# Patient Record
Sex: Female | Born: 1980 | Race: White | Hispanic: No | Marital: Single | Smoking: Current every day smoker
Health system: Southern US, Community
[De-identification: ages and names within clinical notes are randomized; demographics above are authoritative.]

## PROBLEM LIST (undated history)

## (undated) DIAGNOSIS — Z315 Encounter for genetic counseling: Secondary | ICD-10-CM

## (undated) DIAGNOSIS — F419 Anxiety disorder, unspecified: Secondary | ICD-10-CM

## (undated) DIAGNOSIS — F32A Depression, unspecified: Secondary | ICD-10-CM

## (undated) DIAGNOSIS — O99341 Other mental disorders complicating pregnancy, first trimester: Secondary | ICD-10-CM

## (undated) DIAGNOSIS — F319 Bipolar disorder, unspecified: Secondary | ICD-10-CM

## (undated) HISTORY — DX: Bipolar disorder, unspecified: F31.9

## (undated) HISTORY — DX: Other mental disorders complicating pregnancy, first trimester: O99.341

## (undated) HISTORY — DX: Encounter for procreative genetic counseling: Z31.5

---

## 2016-03-19 DIAGNOSIS — F319 Bipolar disorder, unspecified: Secondary | ICD-10-CM

## 2016-03-19 DIAGNOSIS — O99341 Other mental disorders complicating pregnancy, first trimester: Secondary | ICD-10-CM

## 2016-03-19 HISTORY — DX: Bipolar disorder, unspecified: F31.9

## 2016-04-03 DIAGNOSIS — Z315 Encounter for genetic counseling: Secondary | ICD-10-CM

## 2016-04-03 HISTORY — DX: Encounter for procreative genetic counseling: Z31.5

## 2016-08-03 ENCOUNTER — Ambulatory Visit: Payer: Medicaid Other | Admitting: Surgery

## 2016-08-03 ENCOUNTER — Telehealth: Payer: Self-pay | Admitting: General Practice

## 2016-08-03 NOTE — Telephone Encounter (Signed)
Called and left a message for the patient to call the office to reschedule her no showed appointment please rescheduled if patient calls back.

## 2016-10-03 DIAGNOSIS — E669 Obesity, unspecified: Secondary | ICD-10-CM | POA: Insufficient documentation

## 2016-10-03 DIAGNOSIS — O0993 Supervision of high risk pregnancy, unspecified, third trimester: Secondary | ICD-10-CM | POA: Insufficient documentation

## 2016-10-03 DIAGNOSIS — O24419 Gestational diabetes mellitus in pregnancy, unspecified control: Secondary | ICD-10-CM | POA: Insufficient documentation

## 2016-10-11 DIAGNOSIS — F17200 Nicotine dependence, unspecified, uncomplicated: Secondary | ICD-10-CM | POA: Insufficient documentation

## 2018-01-22 HISTORY — PX: WRIST SURGERY: SHX841

## 2019-01-23 HISTORY — PX: ENDOSCOPIC PLANTAR FASCIOTOMY: SUR443

## 2019-08-14 ENCOUNTER — Ambulatory Visit: Payer: Medicaid Other | Admitting: Podiatry

## 2019-08-25 ENCOUNTER — Ambulatory Visit: Payer: Medicaid Other | Admitting: Podiatry

## 2019-08-25 ENCOUNTER — Other Ambulatory Visit: Payer: Self-pay

## 2019-08-25 ENCOUNTER — Ambulatory Visit (INDEPENDENT_AMBULATORY_CARE_PROVIDER_SITE_OTHER): Payer: Medicaid Other

## 2019-08-25 ENCOUNTER — Other Ambulatory Visit: Payer: Self-pay | Admitting: Podiatry

## 2019-08-25 DIAGNOSIS — M722 Plantar fascial fibromatosis: Secondary | ICD-10-CM

## 2019-08-25 DIAGNOSIS — M79671 Pain in right foot: Secondary | ICD-10-CM

## 2019-08-27 ENCOUNTER — Encounter: Payer: Self-pay | Admitting: Podiatry

## 2019-08-27 NOTE — Progress Notes (Signed)
Subjective:  Patient ID: Sherri Jacobson, female    DOB: [birth year],  MRN: [id number]  Chief Complaint  Patient presents with  . Foot Pain    pt is here for right foot bottom/ back of the right heel, pain has been going on for 3-5 months. pt states that the pain is elevated to the touch.    39 y.o. female presents with the above complaint.  Patient presents with complaint of right plantar fasciitis.  Patient states that it has been going on for quite some time.  Patient states his pain is elevated to touch.  Patient was seen by another orthopedic for which she had multiple work-up done with MRIs as well as injections immobilization orthotics management however she has failed all conservative therapy.  She does not have the records with her however she would like for [place] to pursue getting the records.  She states that the pain is gradual patient has tried stretching she has been dealing with this for a long period of time.  She has not had had any surgery to the foot.  She denies any other acute complaints.   Review of Systems: Negative except as noted in the HPI. Denies N/V/F/Ch.  Past Medical History:  Diagnosis Date  . Bipolar disease during pregnancy in first trimester (HCC) 03/19/2016  . Encounter for procreative genetic counseling 04/03/2016   Overview:  Genetic counseling visit on 04/10/2016  Aneuploidy screening/ testing:  First trimester screening; results negative  Carrier screening:  Genetic counseling visit pending    Current Outpatient Medications:  .  IRON PO, Take by mouth., Disp: , Rfl:  .  traMADol (ULTRAM) 50 MG tablet, Take by mouth every 6 (six) hours as needed., Disp: , Rfl:   Social History   Tobacco Use  Smoking Status Not on file    No Known Allergies Objective:  There were no vitals filed for this visit. There is no height or weight on file to calculate BMI. Constitutional Well developed. Well nourished.  Vascular Dorsalis pedis pulses palpable  bilaterally. Posterior tibial pulses palpable bilaterally. Capillary refill normal to all digits.  No cyanosis or clubbing noted. Pedal hair growth normal.  Neurologic Normal speech. Oriented to person, place, and time. Epicritic sensation to light touch grossly present bilaterally.  Dermatologic Nails well groomed and normal in appearance. No open wounds. No skin lesions.  Orthopedic: Normal joint ROM without pain or crepitus bilaterally. No visible deformities. Tender to palpation at the calcaneal tuber right. No pain with calcaneal squeeze right. Ankle ROM diminished range of motion right. Silfverskiold Test: positive right.   Radiographs: Taken and reviewed. No acute fractures or dislocations. No evidence of stress fracture.  Plantar heel spur present. Posterior heel spur present.   Assessment:   1. Foot pain, right    Plan:  Patient was evaluated and treated and all questions answered.  Plantar Fasciitis, right -I reviewed x-ray findings as well as discussed various clinical options.  Given that patient has had had multiple injection to the area without any relief.  Given that patient is ambulating with a cam boot without any relief.  The next best course of action is surgical planning however patient has had a recent MRI done for which I am not able to access through epic.  We will plan on getting paper charts and records and based on the findings either we will reattempt to do another MRI or discuss surgical options at that time.  Patient states understanding.  For now we  will continue to manage with cam boot immobilization. -I will see her back again in 3 weeks to review records.  No follow-ups on file.

## 2019-09-08 ENCOUNTER — Ambulatory Visit: Payer: Medicaid Other | Admitting: Podiatry

## 2019-09-08 ENCOUNTER — Other Ambulatory Visit: Payer: Self-pay

## 2019-09-08 DIAGNOSIS — M216X1 Other acquired deformities of right foot: Secondary | ICD-10-CM

## 2019-09-08 DIAGNOSIS — M21861 Other specified acquired deformities of right lower leg: Secondary | ICD-10-CM

## 2019-09-08 DIAGNOSIS — M722 Plantar fascial fibromatosis: Secondary | ICD-10-CM | POA: Diagnosis not present

## 2019-09-08 DIAGNOSIS — M79671 Pain in right foot: Secondary | ICD-10-CM

## 2019-09-09 ENCOUNTER — Encounter: Payer: Self-pay | Admitting: Podiatry

## 2019-09-09 NOTE — Progress Notes (Signed)
Subjective:  Patient ID: Sherri Jacobson, female    DOB: August 30, 1980,  MRN: [id number]  No chief complaint on file.   39 y.o. female presents with the above complaint.  Patient presents with a follow-up of right plantar fasciitis chronic in nature continuous pain.  She states the pain has not gotten any better.  She is here for surgical intervention at this time.  She has went to multiple podiatrist in the past has multiple injections tried all conservative therapy including immobilization orthotics and has failed all of them.  At this point she would like to discuss surgery to have it fixed.  She states the pain is unbearable she has not been able to ambulate properly.  She denies any other acute complaints.   Review of Systems: Negative except as noted in the HPI. Denies N/V/F/Ch.  Past Medical History:  Diagnosis Date  . Bipolar disease during pregnancy in first trimester (HCC) 03/19/2016  . Encounter for procreative genetic counseling 04/03/2016   Overview:  Genetic counseling visit on 04/10/2016  Aneuploidy screening/ testing:  First trimester screening; results negative  Carrier screening:  Genetic counseling visit pending    Current Outpatient Medications:  .  IRON PO, Take by mouth., Disp: , Rfl:  .  traMADol (ULTRAM) 50 MG tablet, Take by mouth every 6 (six) hours as needed., Disp: , Rfl:   Social History   Tobacco Use  Smoking Status Not on file    No Known Allergies Objective:  There were no vitals filed for this visit. There is no height or weight on file to calculate BMI. Constitutional Well developed. Well nourished.  Vascular Dorsalis pedis pulses palpable bilaterally. Posterior tibial pulses palpable bilaterally. Capillary refill normal to all digits.  No cyanosis or clubbing noted. Pedal hair growth normal.  Neurologic Normal speech. Oriented to person, place, and time. Epicritic sensation to light touch grossly present bilaterally.  Dermatologic Nails well  groomed and normal in appearance. No open wounds. No skin lesions.  Orthopedic: Normal joint ROM without pain or crepitus bilaterally. No visible deformities. Tender to palpation at the calcaneal tuber right. No pain with calcaneal squeeze right. Ankle ROM diminished range of motion right. Silfverskiold Test: positive right.   Radiographs: Taken and reviewed. No acute fractures or dislocations. No evidence of stress fracture.  Plantar heel spur present. Posterior heel spur present.   Assessment:   1. Foot pain, right   2. Plantar fasciitis of right foot   3. Gastrocnemius equinus of right lower extremity    Plan:  Patient was evaluated and treated and all questions answered.  Plantar Fasciitis, right with underlying gastrocnemius equinus -I reviewed x-ray findings as well as discussed various clinical options.  Given that patient has had had multiple injection to the area without any relief.  Given that patient is ambulating with a cam boot without any relief.   -MRI was obtained from [facility].  I reviewed the MRI findings which showed plantar fasciitis chronic in nature.  I discussed with the my surgical planning which included right foot endoscopic plantar fasciotomy with left gastrocnemius recession.  I discussed this in extensive detail with the patient.  I reviewed all my imaging with the patient.  Given that patient has failed all conservative therapy at this is the next best treatment option.  Patient agrees with the plan would like to proceed with the surgery. -I discussed my postop protocol with the patient which includes weightbearing as tolerated in a cam boot followed by transition to surgical  shoe and regular sneakers.  Patient states understanding -Informed surgical risk consent was reviewed and read aloud to the patient.  I reviewed the films.  I have discussed my findings with the patient in great detail.  I have discussed all risks including but not limited to infection,  stiffness, scarring, limp, disability, deformity, damage to blood vessels and nerves, numbness, poor healing, need for braces, arthritis, chronic pain, amputation, death.  All benefits and realistic expectations discussed in great detail.  I have made no promises as to the outcome.  I have provided realistic expectations.  I have offered the patient a 2nd opinion, which they have declined and assured me they preferred to proceed despite the risks -A total of 37 minutes was spent in direct patient care as well as pre and post patient encounter activities.  This includes documentation as well as reviewing patient chart for labs, imaging, past medical, surgical, social, and family history as documented in the EMR.  I have reviewed medication allergies as documented in EMR.  I discussed the etiology of condition and treatment options from conservative to surgical care.  All risks and benefit of the treatment course was discussed in detail.  All questions were answered and return appointment was discussed.  Since the visit completed in an ambulatory/outpatient setting, the patient and/or parent/guardian has been advised to contact the providers office for worsening condition and seek medical treatment and/or call 911 if the patient deems either is necessary. -Patient will also reach out to pain management to review and discussed medication for acute postop pain.  I generally tend to do Percocet 10 mg for acute postop pain.  But I did let the patient know to discuss with her chronic pain management that he will be undergoing surgery and if they would like to prescribe pain medication versus me prescribed for acute postop pain I am more than happy to do that.  Patient states understanding will do that the next scheduled visit which is next week.  No follow-ups on file.

## 2019-09-23 ENCOUNTER — Encounter: Payer: Self-pay | Admitting: *Deleted

## 2019-10-05 ENCOUNTER — Telehealth: Payer: Self-pay | Admitting: Podiatry

## 2019-10-05 DIAGNOSIS — M722 Plantar fascial fibromatosis: Secondary | ICD-10-CM

## 2019-10-05 DIAGNOSIS — M216X1 Other acquired deformities of right foot: Secondary | ICD-10-CM | POA: Diagnosis not present

## 2019-10-05 MED ORDER — OXYCODONE-ACETAMINOPHEN 10-325 MG PO TABS: 1.0000 | ORAL_TABLET | Freq: Four times a day (QID) | ORAL | 0 refills | Status: AC | PRN

## 2019-10-05 MED ORDER — IBUPROFEN 800 MG PO TABS: 800.0000 mg | ORAL_TABLET | Freq: Four times a day (QID) | ORAL | 1 refills | Status: DC | PRN

## 2019-10-05 NOTE — Addendum Note (Signed)
Addended by: Nicholes Rough on: 10/05/2019 04:31 PM   Modules accepted: Orders

## 2019-10-05 NOTE — Telephone Encounter (Signed)
"  I have called twice already.  I had surgery this morning.  I have tried to get my pain medication but I've been told by the pharmacist that it's not there."  Dr. Allena Katz submitted it this morning.  "It's not there.  What am I going to do?  It's going to be too late once he gets the message.  What am I supposed to do?"  I'll call the pharmacy.  I am calling in regards to a prescription for [facility].  The patient said she has called several times and was told the prescription was not there.  "She's correct, no prescriptions are in the [facility]' system.  Please have the doctor resubmit.  We can take the order for the Ibuprofen over the phone."  The Ibuprofen prescription is for 800 mg, quantity of 60, po one q 6 hrs prn pain, one refill, Dr. Nicholes Rough.  I spoke to the pharmacist and I gave them the information for the Ibuprofen but Dr. Allena Katz has to resubmit the Percocet.  "I can't go without pain medication.  I just had surgery!  What am I going to do?  I can't wait until tomorrow!"  You won't have to wait until tomorrow.  I'll send a message directly to Dr. Allena Katz.

## 2019-10-05 NOTE — Addendum Note (Signed)
Addended by: Nicholes Rough on: 10/05/2019 09:19 AM   Modules accepted: Orders

## 2019-10-09 ENCOUNTER — Telehealth: Payer: Self-pay

## 2019-10-09 NOTE — Telephone Encounter (Signed)
DOS 10/05/2019  EPF RT - 47340 GASTROCNEMIUS RECESS RT - 37096  HEALTHY BLUE EFFECTIVE 07/23/2019  RECEIVED AUTH # [id number] GOOD FROM 10/05/2019 - 11/03/2019

## 2019-10-13 ENCOUNTER — Ambulatory Visit (INDEPENDENT_AMBULATORY_CARE_PROVIDER_SITE_OTHER): Payer: Medicaid Other | Admitting: Podiatry

## 2019-10-13 ENCOUNTER — Other Ambulatory Visit: Payer: Self-pay

## 2019-10-13 ENCOUNTER — Encounter: Payer: Self-pay | Admitting: Podiatry

## 2019-10-13 VITALS — BP 142/90 | HR 113 | Temp 99.5°F

## 2019-10-13 DIAGNOSIS — M722 Plantar fascial fibromatosis: Secondary | ICD-10-CM

## 2019-10-13 DIAGNOSIS — M21861 Other specified acquired deformities of right lower leg: Secondary | ICD-10-CM

## 2019-10-13 DIAGNOSIS — M216X1 Other acquired deformities of right foot: Secondary | ICD-10-CM

## 2019-10-13 DIAGNOSIS — Z9889 Other specified postprocedural states: Secondary | ICD-10-CM

## 2019-10-13 DIAGNOSIS — M62461 Contracture of muscle, right lower leg: Secondary | ICD-10-CM

## 2019-10-13 MED ORDER — OXYCODONE-ACETAMINOPHEN 10-325 MG PO TABS: 1.0000 | ORAL_TABLET | ORAL | 0 refills | Status: DC | PRN

## 2019-10-13 NOTE — Progress Notes (Signed)
  Subjective:  Patient ID: Sherri Jacobson, female    DOB: 08-Nov-1980,  MRN: [id number]  Chief Complaint  Patient presents with  . Routine Post Op    POV #1 DOS 10/05/19 EPF AND GASTROCNEMIUS RECESS RT     39 y.o. female returns for post-op check.  Patient is doing well.  She has mild pain.  She has been able to ambulate slowly with the boot on.  She denies any other acute complaints.  Her bandages are clean dry and intact.  Review of Systems: Negative except as noted in the HPI. Denies N/V/F/Ch.  Past Medical History:  Diagnosis Date  . Bipolar disease during pregnancy in first trimester (HCC) 03/19/2016  . Encounter for procreative genetic counseling 04/03/2016   Overview:  Genetic counseling visit on 04/10/2016  Aneuploidy screening/ testing:  First trimester screening; results negative  Carrier screening:  Genetic counseling visit pending    Current Outpatient Medications:  .  ibuprofen (ADVIL) 800 MG tablet, Take 1 tablet (800 mg total) by mouth every 6 (six) hours as needed., Disp: 60 tablet, Rfl: 1 .  IRON PO, Take by mouth., Disp: , Rfl:  .  oxyCODONE-acetaminophen (PERCOCET) 10-325 MG tablet, Take 1 tablet by mouth every 6 (six) hours as needed for up to 8 days for pain., Disp: 30 tablet, Rfl: 0 .  oxyCODONE-acetaminophen (PERCOCET) 10-325 MG tablet, Take 1 tablet by mouth every 4 (four) hours as needed for pain., Disp: 30 tablet, Rfl: 0 .  traMADol (ULTRAM) 50 MG tablet, Take by mouth every 6 (six) hours as needed., Disp: , Rfl:   Social History   Tobacco Use  Smoking Status Not on file    No Known Allergies Objective:   Vitals:   10/13/19 0933  BP: (!) 142/90  Pulse: (!) 113  Temp: 99.5 F (37.5 C)   There is no height or weight on file to calculate BMI. Constitutional Well developed. Well nourished.  Vascular Foot warm and well perfused. Capillary refill normal to all digits.   Neurologic Normal speech. Oriented to person, place, and time. Epicritic sensation  to light touch grossly present bilaterally.  Dermatologic Skin healing well without signs of infection. Skin edges well coapted without signs of infection.  Orthopedic: Tenderness to palpation noted about the surgical site.   Radiographs: None Assessment:   1. Plantar fasciitis of right foot   2. Gastrocnemius equinus of right lower extremity   3. Status post foot surgery    Plan:  Patient was evaluated and treated and all questions answered.  S/p foot surgery right -Progressing as expected post-operatively. -XR: None -WB Status: Weightbearing as tolerated in cam boot -Sutures: Intact.  No signs of dehiscence noted.  No clinical signs of infection noted. -Medications: Percocet -Foot redressed.  No follow-ups on file.

## 2019-10-20 ENCOUNTER — Encounter: Payer: Medicaid Other | Admitting: Podiatry

## 2019-10-22 ENCOUNTER — Ambulatory Visit: Payer: Medicaid Other | Admitting: Podiatry

## 2019-10-22 ENCOUNTER — Other Ambulatory Visit: Payer: Self-pay

## 2019-10-22 ENCOUNTER — Encounter: Payer: Self-pay | Admitting: Podiatry

## 2019-10-22 DIAGNOSIS — B351 Tinea unguium: Secondary | ICD-10-CM

## 2019-10-22 DIAGNOSIS — M722 Plantar fascial fibromatosis: Secondary | ICD-10-CM

## 2019-10-22 DIAGNOSIS — Z79899 Other long term (current) drug therapy: Secondary | ICD-10-CM

## 2019-10-22 DIAGNOSIS — Z9889 Other specified postprocedural states: Secondary | ICD-10-CM

## 2019-10-22 DIAGNOSIS — M21861 Other specified acquired deformities of right lower leg: Secondary | ICD-10-CM

## 2019-10-22 NOTE — Progress Notes (Signed)
Subjective:  Patient ID: Sherri Jacobson, female    DOB: 07-Dec-1980,  MRN: [id number]  Chief Complaint  Patient presents with  . Routine Post Op    POV #2 DOS 10/05/19 EPF AND GASTROCNEMIUS RECESS RT     39 y.o. female returns for post-op check.  Patient is doing well.  She has mild pain.  She has been able to ambulate with the boot on without acute complaints.  She is here to take her stitches out today.  She has secondary complaint of bilateral hallux onychomycosis for which she would like to discuss treatment options.  She states that this has been bothering for quite some time it came out of nowhere.  She denies any other acute complaints she has not tried any other treatment options  Review of Systems: Negative except as noted in the HPI. Denies N/V/F/Ch.  Past Medical History:  Diagnosis Date  . Bipolar disease during pregnancy in first trimester (HCC) 03/19/2016  . Encounter for procreative genetic counseling 04/03/2016   Overview:  Genetic counseling visit on 04/10/2016  Aneuploidy screening/ testing:  First trimester screening; results negative  Carrier screening:  Genetic counseling visit pending    Current Outpatient Medications:  .  ibuprofen (ADVIL) 800 MG tablet, Take 1 tablet (800 mg total) by mouth every 6 (six) hours as needed., Disp: 60 tablet, Rfl: 1 .  IRON PO, Take by mouth., Disp: , Rfl:  .  oxyCODONE-acetaminophen (PERCOCET) 10-325 MG tablet, Take 1 tablet by mouth every 4 (four) hours as needed for pain., Disp: 30 tablet, Rfl: 0 .  traMADol (ULTRAM) 50 MG tablet, Take by mouth every 6 (six) hours as needed., Disp: , Rfl:   Social History   Tobacco Use  Smoking Status Not on file    No Known Allergies Objective:   There were no vitals filed for this visit. There is no height or weight on file to calculate BMI. Constitutional Well developed. Well nourished.  Vascular Foot warm and well perfused. Capillary refill normal to all digits.   Neurologic Normal  speech. Oriented to person, place, and time. Epicritic sensation to light touch grossly present bilaterally.  Dermatologic Skin healing well without signs of infection. Skin edges well coapted without signs of infection.  Thickened elongated dystrophic toenail noted to bilateral hallux.  Mild pain on palpation mild dystrophy noted bilaterally.  Orthopedic: Tenderness to palpation noted about the surgical site.   Radiographs: None Assessment:   1. Encounter for long-term current use of medication   2. Plantar fasciitis of right foot   3. Gastrocnemius equinus of right lower extremity   4. Status post foot surgery   5. Onychomycosis due to dermatophyte    Plan:  Patient was evaluated and treated and all questions answered.  S/p foot surgery right -Progressing as expected post-operatively. -XR: None -WB Status: She can begin transition from cam boot into regular sneakers. -Sutures: Stitches were removed.  No signs of dehiscence noted no clinical signs of infection noted. -Medications: Percocet -Physical therapy prescription was given to the patient to undergo physical therapy for general deconditioning as well as gait training at [facility].  Onychomycosis bilateral hallux -Educated the patient on the etiology of onychomycosis and various treatment options associated with improving the fungal load.  I explained to the patient that there is 3 treatment options available to treat the onychomycosis including topical, p.o., laser treatment.  Patient elected to undergo p.o. options with Lamisil/terbinafine therapy.  In order for me to start the medication therapy,  I explained to the patient the importance of evaluating the liver and obtaining the liver function test.  Once the liver function test comes back normal I will start him on 41-month course of Lamisil therapy.  Patient understood all risk and would like to proceed with Lamisil therapy.  I have asked the patient to immediately stop the  Lamisil therapy if she has any reactions to it and call the office or go to the emergency room right away.  Patient states understanding   No follow-ups on file.

## 2019-11-03 ENCOUNTER — Encounter: Payer: Medicaid Other | Admitting: Podiatry

## 2019-11-05 ENCOUNTER — Other Ambulatory Visit: Payer: Self-pay | Admitting: Podiatry

## 2019-11-17 ENCOUNTER — Other Ambulatory Visit: Payer: Self-pay

## 2019-11-17 ENCOUNTER — Ambulatory Visit (INDEPENDENT_AMBULATORY_CARE_PROVIDER_SITE_OTHER): Payer: Medicaid Other | Admitting: Podiatry

## 2019-11-17 ENCOUNTER — Encounter: Payer: Self-pay | Admitting: Podiatry

## 2019-11-17 DIAGNOSIS — M722 Plantar fascial fibromatosis: Secondary | ICD-10-CM

## 2019-11-17 DIAGNOSIS — Z9889 Other specified postprocedural states: Secondary | ICD-10-CM

## 2019-11-17 NOTE — Progress Notes (Signed)
Subjective:  Patient ID: Sherri Jacobson, female    DOB: 29-Jan-1980,  MRN: [id number]  Chief Complaint  Patient presents with  . Routine Post Op    POV #3 DOS 10/05/19 EPF AND GASTROCNEMIUS RECESS RT  "Its getting better every day, but still very numb"     39 y.o. female returns for post-op check.  Overall patient is doing really well.  She does not have any pain.  She just has occasional numbness.  She denies any other acute complaints.  She would like to know if she can go back to regular activities.  She states physical therapy helped for a little bit now she is doing it herself.  Review of Systems: Negative except as noted in the HPI. Denies N/V/F/Ch.  Past Medical History:  Diagnosis Date  . Bipolar disease during pregnancy in first trimester (HCC) 03/19/2016  . Encounter for procreative genetic counseling 04/03/2016   Overview:  Genetic counseling visit on 04/10/2016  Aneuploidy screening/ testing:  First trimester screening; results negative  Carrier screening:  Genetic counseling visit pending    Current Outpatient Medications:  .  ibuprofen (ADVIL) 800 MG tablet, TAKE 1 TABLET BY MOUTH EVERY 6 HOURS AS NEEDED, Disp: 60 tablet, Rfl: 1 .  IRON PO, Take by mouth., Disp: , Rfl:  .  oxyCODONE-acetaminophen (PERCOCET) 10-325 MG tablet, Take 1 tablet by mouth every 4 (four) hours as needed for pain., Disp: 30 tablet, Rfl: 0 .  traMADol (ULTRAM) 50 MG tablet, Take by mouth every 6 (six) hours as needed., Disp: , Rfl:   Social History   Tobacco Use  Smoking Status Not on file    No Known Allergies Objective:   There were no vitals filed for this visit. There is no height or weight on file to calculate BMI. Constitutional Well developed. Well nourished.  Vascular Foot warm and well perfused. Capillary refill normal to all digits.   Neurologic Normal speech. Oriented to person, place, and time. Epicritic sensation to light touch grossly present bilaterally.  Dermatologic  good  range of motion noted at the ankle joint.  Mild tenderness to the medial calcaneal tuber otherwise good correction noted.  Thickened elongated dystrophic toenail noted to bilateral hallux.  Mild pain on palpation mild dystrophy noted bilaterally.  Orthopedic:  No tenderness to palpation noted about the surgical site.   Radiographs: None Assessment:   1. Plantar fasciitis of right foot   2. Status post foot surgery    Plan:  Patient was evaluated and treated and all questions answered.  S/p foot surgery right -Progressing as expected post-operatively. -XR: None -WB Status: Regular shoes -Sutures: None -Medications: None -Patient has done physical therapy.  Her pain has clinically healed.  She has been able to transition to regular sneaker without any issues. -I discussed with her that if any foot and ankle issues arise in the future come back and see me.  Patient states understanding.  Onychomycosis bilateral hallux -Educated the patient on the etiology of onychomycosis and various treatment options associated with improving the fungal load.  I explained to the patient that there is 3 treatment options available to treat the onychomycosis including topical, p.o., laser treatment.  Patient elected to undergo p.o. options with Lamisil/terbinafine therapy.  In order for me to start the medication therapy, I explained to the patient the importance of evaluating the liver and obtaining the liver function test.  Once the liver function test comes back normal I will start him on 61-month course of  Lamisil therapy.  Patient understood all risk and would like to proceed with Lamisil therapy.  I have asked the patient to immediately stop the Lamisil therapy if she has any reactions to it and call the office or go to the emergency room right away.  Patient states understanding   No follow-ups on file.

## 2019-11-24 ENCOUNTER — Encounter: Payer: Self-pay | Admitting: *Deleted

## 2019-11-24 ENCOUNTER — Emergency Department
Admission: EM | Admit: 2019-11-24 | Discharge: 2019-11-24 | Disposition: A | Discharge status: Home / Self Care | Payer: Medicaid Other | Attending: Emergency Medicine | Admitting: Emergency Medicine

## 2019-11-24 ENCOUNTER — Other Ambulatory Visit: Payer: Self-pay

## 2019-11-24 ENCOUNTER — Emergency Department: Payer: Medicaid Other

## 2019-11-24 DIAGNOSIS — M25561 Pain in right knee: Secondary | ICD-10-CM | POA: Diagnosis present

## 2019-11-24 DIAGNOSIS — Z5321 Procedure and treatment not carried out due to patient leaving prior to being seen by health care provider: Secondary | ICD-10-CM | POA: Insufficient documentation

## 2019-11-24 LAB — CBC WITH DIFFERENTIAL/PLATELET
Abs Immature Granulocytes: 0.11 10*3/uL — ABNORMAL HIGH (ref 0.00–0.07)
Basophils Absolute: 0.1 10*3/uL (ref 0.0–0.1)
Basophils Relative: 1 %
Eosinophils Absolute: 0.3 10*3/uL (ref 0.0–0.5)
Eosinophils Relative: 2 %
HCT: 41 % (ref 36.0–46.0)
Hemoglobin: 13.2 g/dL (ref 12.0–15.0)
Immature Granulocytes: 1 %
Lymphocytes Relative: 31 %
Lymphs Abs: 3.9 10*3/uL (ref 0.7–4.0)
MCH: 29.2 pg (ref 26.0–34.0)
MCHC: 32.2 g/dL (ref 30.0–36.0)
MCV: 90.7 fL (ref 80.0–100.0)
Monocytes Absolute: 0.7 10*3/uL (ref 0.1–1.0)
Monocytes Relative: 6 %
Neutro Abs: 7.6 10*3/uL (ref 1.7–7.7)
Neutrophils Relative %: 59 %
Platelets: 285 10*3/uL (ref 150–400)
RBC: 4.52 MIL/uL (ref 3.87–5.11)
RDW: 13 % (ref 11.5–15.5)
WBC: 12.7 10*3/uL — ABNORMAL HIGH (ref 4.0–10.5)
nRBC: 0 % (ref 0.0–0.2)

## 2019-11-24 LAB — BASIC METABOLIC PANEL
Anion gap: 9 (ref 5–15)
BUN: 11 mg/dL (ref 6–20)
CO2: 26 mmol/L (ref 22–32)
Calcium: 8.8 mg/dL — ABNORMAL LOW (ref 8.9–10.3)
Chloride: 104 mmol/L (ref 98–111)
Creatinine, Ser: 0.59 mg/dL (ref 0.44–1.00)
GFR, Estimated: >60 mL/min (ref >60)
Glucose, Bld: 110 mg/dL — ABNORMAL HIGH (ref 70–99)
Potassium: 3.7 mmol/L (ref 3.5–5.1)
Sodium: 139 mmol/L (ref 135–145)

## 2019-11-24 NOTE — ED Triage Notes (Signed)
Pt had surgery on right leg 8 weeks ago for plantar fasciitis.  No pt having right lateral knee pain and is concerned of blood clot in right lower leg.  Pain for 4 days.  Pain shoots down into right calf.    Pt alert

## 2019-11-24 NOTE — ED Notes (Signed)
No answer when called several times from lobby 

## 2019-11-24 NOTE — ED Notes (Signed)
No answer when called several times from lobby & cell phone 

## 2019-11-25 ENCOUNTER — Telehealth: Payer: Self-pay | Admitting: Emergency Medicine

## 2019-11-25 NOTE — Telephone Encounter (Addendum)
Called patient due to left emergency department before provider exam to inquire about condition and follow up plans.  Left message   She called me back.  I asked her to call her doctor and have them review her labs and the ultrasound

## 2020-02-02 ENCOUNTER — Other Ambulatory Visit: Payer: Self-pay | Admitting: Podiatry

## 2020-02-02 LAB — HEPATIC FUNCTION PANEL
ALT: 38 IU/L — ABNORMAL HIGH (ref 0–32)
AST: 22 IU/L (ref 0–40)
Albumin: 4.5 g/dL (ref 3.8–4.8)
Alkaline Phosphatase: 79 IU/L (ref 44–121)
Bilirubin Total: 0.2 mg/dL (ref 0.0–1.2)
Bilirubin, Direct: <0.1 mg/dL (ref 0.00–0.40)
Total Protein: 7.4 g/dL (ref 6.0–8.5)

## 2020-02-02 MED ORDER — TERBINAFINE HCL 250 MG PO TABS: 250.0000 mg | ORAL_TABLET | Freq: Every day | ORAL | 0 refills | Status: DC

## 2020-03-24 ENCOUNTER — Encounter: Payer: Medicaid Other | Admitting: Podiatry

## 2020-04-12 ENCOUNTER — Ambulatory Visit: Payer: Medicaid Other | Admitting: Podiatry

## 2020-04-14 ENCOUNTER — Encounter: Payer: Self-pay | Admitting: Podiatry

## 2020-04-14 ENCOUNTER — Ambulatory Visit: Payer: Medicaid Other | Admitting: Podiatry

## 2020-04-14 ENCOUNTER — Other Ambulatory Visit: Payer: Self-pay

## 2020-04-14 DIAGNOSIS — M722 Plantar fascial fibromatosis: Secondary | ICD-10-CM | POA: Diagnosis not present

## 2020-04-14 DIAGNOSIS — M216X2 Other acquired deformities of left foot: Secondary | ICD-10-CM

## 2020-04-14 DIAGNOSIS — M21862 Other specified acquired deformities of left lower leg: Secondary | ICD-10-CM

## 2020-04-14 MED ORDER — MELOXICAM 15 MG PO TABS: 15.0000 mg | ORAL_TABLET | Freq: Every day | ORAL | 2 refills | Status: DC

## 2020-04-14 NOTE — Progress Notes (Signed)
Subjective:  Patient ID: Sherri Jacobson, female    DOB: [birth year],  MRN: [id number]  Chief Complaint  Patient presents with  . Foot Pain    "My right foot is perfect, but now my left foot is doing the exact same thing as the right one.  It started about 2 weeks ago and its very painful in the back of the heel and goes up my leg.  Its painful to stand up and walk"    40 y.o. female presents with the above complaint.  Patient now presents to the complaint of left heel pain.  Patient states is doing the same exact thing is the right one.  The right side is doing wonderful.  She would like to discuss treatment options for the left side.  She states that she does not want injection as it has not helped as much.  She would like to discuss other conservative treatment options.  Is painful when standing up and walking.  Her pain scale 7 out of 10 is progressive gotten worse is sharp shooting to the heel.  She denies any other acute complaints.   Review of Systems: Negative except as noted in the HPI. Denies N/V/F/Ch.  Past Medical History:  Diagnosis Date  . Bipolar disease during pregnancy in first trimester (HCC) 03/19/2016  . Encounter for procreative genetic counseling 04/03/2016   Overview:  Genetic counseling visit on 04/10/2016  Aneuploidy screening/ testing:  First trimester screening; results negative  Carrier screening:  Genetic counseling visit pending    Current Outpatient Medications:  .  meloxicam (MOBIC) 15 MG tablet, Take 1 tablet (15 mg total) by mouth daily., Disp: 30 tablet, Rfl: 2 .  terbinafine (LAMISIL) 250 MG tablet, Take 1 tablet (250 mg total) by mouth daily., Disp: 90 tablet, Rfl: 0 .  ALPRAZolam (XANAX) 0.5 MG tablet, Take 0.5 mg by mouth 2 (two) times daily as needed., Disp: , Rfl:  .  amoxicillin (AMOXIL) 500 MG tablet, Take 500 mg by mouth every 8 (eight) hours., Disp: , Rfl:  .  gabapentin (NEURONTIN) 300 MG capsule, Take by mouth., Disp: , Rfl:  .  ibuprofen (ADVIL)  800 MG tablet, TAKE 1 TABLET BY MOUTH EVERY 6 HOURS AS NEEDED, Disp: 60 tablet, Rfl: 1 .  IRON PO, Take by mouth., Disp: , Rfl:  .  LORazepam (ATIVAN) 0.5 MG tablet, , Disp: , Rfl:  .  methocarbamol (ROBAXIN) 500 MG tablet, Take 500 mg by mouth 2 (two) times daily., Disp: , Rfl:  .  oxyCODONE-acetaminophen (PERCOCET) 10-325 MG tablet, Take 1 tablet by mouth every 4 (four) hours as needed for pain., Disp: 30 tablet, Rfl: 0 .  predniSONE (STERAPRED UNI-PAK 48 TAB) 10 MG (48) TBPK tablet, Take by mouth as directed., Disp: , Rfl:  .  tiZANidine (ZANAFLEX) 4 MG tablet, TAKE 1 TO 2 TABLETS BY MOUTH EVERY NIGHT AT BEDTIME AS NEEDED FOR INSOMNIA, Disp: , Rfl:  .  traMADol (ULTRAM) 50 MG tablet, Take by mouth every 6 (six) hours as needed., Disp: , Rfl:   Social History   Tobacco Use  Smoking Status Current Every Day Smoker  Smokeless Tobacco Never Used    No Known Allergies Objective:  There were no vitals filed for this visit. There is no height or weight on file to calculate BMI. Constitutional Well developed. Well nourished.  Vascular Dorsalis pedis pulses palpable bilaterally. Posterior tibial pulses palpable bilaterally. Capillary refill normal to all digits.  No cyanosis or clubbing noted. Pedal hair growth  normal.  Neurologic Normal speech. Oriented to person, place, and time. Epicritic sensation to light touch grossly present bilaterally.  Dermatologic Nails well groomed and normal in appearance. No open wounds. No skin lesions.  Orthopedic: Normal joint ROM without pain or crepitus bilaterally. No visible deformities. Tender to palpation at the calcaneal tuber left. No pain with calcaneal squeeze left. Ankle ROM diminished range of motion left. Silfverskiold Test: positive left.   Radiographs: None  Assessment:   1. Plantar fasciitis, left   2. Gastrocnemius equinus, left    Plan:  Patient was evaluated and treated and all questions answered.  Plantar Fasciitis,  left - XR reviewed as above.  - Educated on icing and stretching. Instructions given.  -I will hold off on any injection as patient does not want any injection and she states that it does not help her. - DME: Continue wearing plantar Fascial Brace and stretching. - Pharmacologic management: Mobic -I will discuss surgical intervention during next clinical visit if there is no improvement with some of these conservative treatment options.  Patient states understanding. -I will plan on obtaining preoperative x-ray during next clinical visit   Onychomycosis bilateral hallux -Educated the patient on the etiology of onychomycosis and various treatment options associated with improving the fungal load.  I explained to the patient that there is 3 treatment options available to treat the onychomycosis including topical, p.o., laser treatment.  Patient elected to undergo p.o. options with Lamisil/terbinafine therapy.  In order for me to start the medication therapy, I explained to the patient the importance of evaluating the liver and obtaining the liver function test.  Once the liver function test comes back normal I will start him on 27-month course of Lamisil therapy.  Patient understood all risk and would like to proceed with Lamisil therapy.  I have asked the patient to immediately stop the Lamisil therapy if she has any reactions to it and call the office or go to the emergency room right away.  Patient states understanding   No follow-ups on file.

## 2020-05-03 ENCOUNTER — Other Ambulatory Visit: Payer: Self-pay

## 2020-05-03 ENCOUNTER — Ambulatory Visit: Payer: Medicaid Other | Admitting: Podiatry

## 2020-05-05 ENCOUNTER — Ambulatory Visit: Payer: Medicaid Other | Admitting: Podiatry

## 2020-05-12 ENCOUNTER — Other Ambulatory Visit: Payer: Self-pay

## 2020-05-12 ENCOUNTER — Ambulatory Visit: Payer: Medicaid Other | Admitting: Podiatry

## 2020-05-12 ENCOUNTER — Ambulatory Visit (INDEPENDENT_AMBULATORY_CARE_PROVIDER_SITE_OTHER): Payer: Medicaid Other

## 2020-05-12 DIAGNOSIS — M216X2 Other acquired deformities of left foot: Secondary | ICD-10-CM | POA: Diagnosis not present

## 2020-05-12 DIAGNOSIS — M722 Plantar fascial fibromatosis: Secondary | ICD-10-CM

## 2020-05-12 DIAGNOSIS — Z01818 Encounter for other preprocedural examination: Secondary | ICD-10-CM

## 2020-05-12 DIAGNOSIS — M21862 Other specified acquired deformities of left lower leg: Secondary | ICD-10-CM

## 2020-05-17 ENCOUNTER — Encounter: Payer: Self-pay | Admitting: Podiatry

## 2020-05-17 NOTE — Progress Notes (Signed)
Subjective:  Patient ID: Sherri Jacobson, female    DOB: [birth year],  MRN: [id number]  Chief Complaint  Patient presents with  . Plantar Fasciitis    Left foot  PT stated that she is still in a lot of pain with her left foot     40 y.o. female presents with the above complaint.  Patient presents with follow-up of left plantar fasciitis.  She states is not getting better.  The injection and the bracing none of which has helped.  She would like to discuss surgical option for left side as well.   Review of Systems: Negative except as noted in the HPI. Denies N/V/F/Ch.  Past Medical History:  Diagnosis Date  . Bipolar disease during pregnancy in first trimester (HCC) 03/19/2016  . Encounter for procreative genetic counseling 04/03/2016   Overview:  Genetic counseling visit on 04/10/2016  Aneuploidy screening/ testing:  First trimester screening; results negative  Carrier screening:  Genetic counseling visit pending    Current Outpatient Medications:  .  terbinafine (LAMISIL) 250 MG tablet, Take 1 tablet (250 mg total) by mouth daily., Disp: 90 tablet, Rfl: 0 .  ALPRAZolam (XANAX) 0.5 MG tablet, Take 0.5 mg by mouth 2 (two) times daily as needed., Disp: , Rfl:  .  amoxicillin (AMOXIL) 500 MG tablet, Take 500 mg by mouth every 8 (eight) hours., Disp: , Rfl:  .  gabapentin (NEURONTIN) 300 MG capsule, Take by mouth., Disp: , Rfl:  .  ibuprofen (ADVIL) 800 MG tablet, TAKE 1 TABLET BY MOUTH EVERY 6 HOURS AS NEEDED, Disp: 60 tablet, Rfl: 1 .  IRON PO, Take by mouth., Disp: , Rfl:  .  LORazepam (ATIVAN) 0.5 MG tablet, , Disp: , Rfl:  .  meloxicam (MOBIC) 15 MG tablet, Take 1 tablet (15 mg total) by mouth daily., Disp: 30 tablet, Rfl: 2 .  methocarbamol (ROBAXIN) 500 MG tablet, Take 500 mg by mouth 2 (two) times daily., Disp: , Rfl:  .  oxyCODONE-acetaminophen (PERCOCET) 10-325 MG tablet, Take 1 tablet by mouth every 4 (four) hours as needed for pain., Disp: 30 tablet, Rfl: 0 .  predniSONE  (STERAPRED UNI-PAK 48 TAB) 10 MG (48) TBPK tablet, Take by mouth as directed., Disp: , Rfl:  .  tiZANidine (ZANAFLEX) 4 MG tablet, TAKE 1 TO 2 TABLETS BY MOUTH EVERY NIGHT AT BEDTIME AS NEEDED FOR INSOMNIA, Disp: , Rfl:  .  traMADol (ULTRAM) 50 MG tablet, Take by mouth every 6 (six) hours as needed., Disp: , Rfl:   Social History   Tobacco Use  Smoking Status Current Every Day Smoker  Smokeless Tobacco Never Used    No Known Allergies Objective:  There were no vitals filed for this visit. There is no height or weight on file to calculate BMI. Constitutional Well developed. Well nourished.  Vascular Dorsalis pedis pulses palpable bilaterally. Posterior tibial pulses palpable bilaterally. Capillary refill normal to all digits.  No cyanosis or clubbing noted. Pedal hair growth normal.  Neurologic Normal speech. Oriented to person, place, and time. Epicritic sensation to light touch grossly present bilaterally.  Dermatologic Nails well groomed and normal in appearance. No open wounds. No skin lesions.  Orthopedic: Normal joint ROM without pain or crepitus bilaterally. No visible deformities. Tender to palpation at the calcaneal tuber left. No pain with calcaneal squeeze left. Ankle ROM diminished range of motion left. Silfverskiold Test: positive left.   Radiographs: None  Assessment:   1. Plantar fasciitis, left   2. Gastrocnemius equinus, left   3.  Preoperative examination    Plan:  Patient was evaluated and treated and all questions answered.  Plantar Fasciitis, left with underlying gastrocnemius equinus -Clinically her pain has not improved with the plantar fascial bracing strain injection.  At this time I believe patient will benefit from surgical management of this.  Given the amount of pain that she is having she would like to go out and focuses on surgical as it is not helping her at all.  I discussed with her in extensive detail about the surgical plan as well as  my preop postoperative plan in extensive detail.  She had previous surgery done in the past by me that seems to be working well.  We will plan on doing the same exact thing.  I discussed this with the patient in extensive detail she states understanding would like to proceed with the surgery. -She can be weightbearing as tolerated in cam boot. -Informed surgical risk consent was reviewed and read aloud to the patient.  I reviewed the films.  I have discussed my findings with the patient in great detail.  I have discussed all risks including but not limited to infection, stiffness, scarring, limp, disability, deformity, damage to blood vessels and nerves, numbness, poor healing, need for braces, arthritis, chronic pain, amputation, death.  All benefits and realistic expectations discussed in great detail.  I have made no promises as to the outcome.  I have provided realistic expectations.  I have offered the patient a 2nd opinion, which they have declined and assured me they preferred to proceed despite the risks -A total of Please 33 minutes was spent in direct patient care as well as pre and post patient encounter activities.  This includes documentation as well as reviewing patient chart for labs, imaging, past medical, surgical, social, and family history as documented in the EMR.  I have reviewed medication allergies as documented in EMR.  I discussed the etiology of condition and treatment options from conservative to surgical care.  All risks and benefit of the treatment course was discussed in detail.  All questions were answered and return appointment was discussed.  Since the visit completed in an ambulatory/outpatient setting, the patient and/or parent/guardian has been advised to contact the providers office for worsening condition and seek medical treatment and/or call 911 if the patient deems either is necessary.     Onychomycosis bilateral hallux -Educated the patient on the etiology of  onychomycosis and various treatment options associated with improving the fungal load.  I explained to the patient that there is 3 treatment options available to treat the onychomycosis including topical, p.o., laser treatment.  Patient elected to undergo p.o. options with Lamisil/terbinafine therapy.  In order for me to start the medication therapy, I explained to the patient the importance of evaluating the liver and obtaining the liver function test.  Once the liver function test comes back normal I will start him on 61-month course of Lamisil therapy.  Patient understood all risk and would like to proceed with Lamisil therapy.  I have asked the patient to immediately stop the Lamisil therapy if she has any reactions to it and call the office or go to the emergency room right away.  Patient states understanding   No follow-ups on file.

## 2020-05-30 ENCOUNTER — Other Ambulatory Visit (HOSPITAL_COMMUNITY): Payer: Self-pay | Admitting: Rheumatology

## 2020-05-30 ENCOUNTER — Other Ambulatory Visit: Payer: Self-pay | Admitting: Rheumatology

## 2020-05-30 DIAGNOSIS — M2550 Pain in unspecified joint: Secondary | ICD-10-CM

## 2020-05-31 ENCOUNTER — Other Ambulatory Visit: Payer: Self-pay

## 2020-05-31 ENCOUNTER — Ambulatory Visit: Payer: Medicaid Other | Admitting: Podiatry

## 2020-05-31 ENCOUNTER — Encounter: Payer: Self-pay | Admitting: Podiatry

## 2020-05-31 ENCOUNTER — Ambulatory Visit (INDEPENDENT_AMBULATORY_CARE_PROVIDER_SITE_OTHER): Payer: Medicaid Other

## 2020-05-31 DIAGNOSIS — M216X1 Other acquired deformities of right foot: Secondary | ICD-10-CM | POA: Diagnosis not present

## 2020-05-31 DIAGNOSIS — M19079 Primary osteoarthritis, unspecified ankle and foot: Secondary | ICD-10-CM

## 2020-05-31 DIAGNOSIS — M778 Other enthesopathies, not elsewhere classified: Secondary | ICD-10-CM

## 2020-05-31 MED ORDER — METHYLPREDNISOLONE 4 MG PO TBPK: ORAL_TABLET | ORAL | 0 refills | Status: DC

## 2020-05-31 NOTE — Progress Notes (Signed)
Subjective:  Patient ID: Sherri Jacobson, female    DOB: 02-Dec-1980,  MRN: [id number]  Chief Complaint  Patient presents with  . Foot Pain    Patient presents today for top of right foot pain x 3-4 weeks    40 y.o. female presents with the above complaint.  Patient presents with a new complaint of right dorsal midfoot pain.  Patient states is painful to touch.  She says been over 3 to 4 weeks has came out of nowhere.  She is scheduled for undergo surgery to the left side.  She would like to discuss options for the right side as well.  She denies any other acute complaints.  She does not want to a steroid shot.  She would like to know if she can get over some pills.  She still has some Mobic left.   Review of Systems: Negative except as noted in the HPI. Denies N/V/F/Ch.  Past Medical History:  Diagnosis Date  . Bipolar disease during pregnancy in first trimester (HCC) 03/19/2016  . Encounter for procreative genetic counseling 04/03/2016   Overview:  Genetic counseling visit on 04/10/2016  Aneuploidy screening/ testing:  First trimester screening; results negative  Carrier screening:  Genetic counseling visit pending    Current Outpatient Medications:  .  methylPREDNISolone (MEDROL DOSEPAK) 4 MG TBPK tablet, Use as directed, Disp: 1 each, Rfl: 0 .  terbinafine (LAMISIL) 250 MG tablet, Take 1 tablet (250 mg total) by mouth daily., Disp: 90 tablet, Rfl: 0 .  ALPRAZolam (XANAX) 0.5 MG tablet, Take 0.5 mg by mouth 2 (two) times daily as needed., Disp: , Rfl:  .  amoxicillin (AMOXIL) 500 MG tablet, Take 500 mg by mouth every 8 (eight) hours., Disp: , Rfl:  .  gabapentin (NEURONTIN) 300 MG capsule, Take by mouth., Disp: , Rfl:  .  ibuprofen (ADVIL) 800 MG tablet, TAKE 1 TABLET BY MOUTH EVERY 6 HOURS AS NEEDED, Disp: 60 tablet, Rfl: 1 .  IRON PO, Take by mouth., Disp: , Rfl:  .  LORazepam (ATIVAN) 0.5 MG tablet, , Disp: , Rfl:  .  meloxicam (MOBIC) 15 MG tablet, Take 1 tablet (15 mg total) by  mouth daily., Disp: 30 tablet, Rfl: 2 .  methocarbamol (ROBAXIN) 500 MG tablet, Take 500 mg by mouth 2 (two) times daily., Disp: , Rfl:  .  oxyCODONE-acetaminophen (PERCOCET) 10-325 MG tablet, Take 1 tablet by mouth every 4 (four) hours as needed for pain., Disp: 30 tablet, Rfl: 0 .  predniSONE (STERAPRED UNI-PAK 48 TAB) 10 MG (48) TBPK tablet, Take by mouth as directed., Disp: , Rfl:  .  tiZANidine (ZANAFLEX) 4 MG tablet, TAKE 1 TO 2 TABLETS BY MOUTH EVERY NIGHT AT BEDTIME AS NEEDED FOR INSOMNIA, Disp: , Rfl:  .  traMADol (ULTRAM) 50 MG tablet, Take by mouth every 6 (six) hours as needed., Disp: , Rfl:   Social History   Tobacco Use  Smoking Status Current Every Day Smoker  Smokeless Tobacco Never Used    No Known Allergies Objective:  There were no vitals filed for this visit. There is no height or weight on file to calculate BMI. Constitutional Well developed. Well nourished.  Vascular Dorsalis pedis pulses palpable bilaterally. Posterior tibial pulses palpable bilaterally. Capillary refill normal to all digits.  No cyanosis or clubbing noted. Pedal hair growth normal.  Neurologic Normal speech. Oriented to person, place, and time. Epicritic sensation to light touch grossly present bilaterally.  Dermatologic Nails well groomed and normal in appearance. No open  wounds. No skin lesions.  Orthopedic:  Pain on palpation right dorsal midfoot.  Mild arthritic changes were clinically palpated with exostosis.   Radiographs: 3 views of skeletally mature adult right foot: Midfoot arthritis noted. Plantar heel spurring noted.  No other bony abnormalities identified.  Assessment:   1. Arthritis of midfoot    Plan:  Patient was evaluated and treated and all questions answered.  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  She states that she had was on her foot for  long period of time this past few days and may have progressively gotten worse.  At this point I discussed with her that she will benefit from steroid injection however she would like to hold off on steroid injection for now.  She will try Mobic which she already has at home.  I will dispense Medrol Dosepak.  If there is no resolve meant will discuss injection at that point.  She states understanding.  No follow-ups on file.

## 2020-06-02 ENCOUNTER — Other Ambulatory Visit: Payer: Self-pay | Admitting: Podiatry

## 2020-06-02 DIAGNOSIS — M19079 Primary osteoarthritis, unspecified ankle and foot: Secondary | ICD-10-CM

## 2020-06-06 ENCOUNTER — Ambulatory Visit: Admission: RE | Admit: 2020-06-06 | Payer: Medicaid Other | Source: Ambulatory Visit

## 2020-06-06 ENCOUNTER — Ambulatory Visit: Payer: Medicaid Other | Attending: Rheumatology

## 2020-07-23 ENCOUNTER — Other Ambulatory Visit: Payer: Self-pay | Admitting: Podiatry

## 2020-07-26 NOTE — Telephone Encounter (Signed)
Please advise 

## 2020-07-28 ENCOUNTER — Telehealth: Payer: Self-pay | Admitting: Urology

## 2020-07-28 NOTE — Telephone Encounter (Signed)
DOS - 08/15/20  EPF  LEFT --- [id number] GASTROCNEMIUS RECESS LEFT --- [id number]   HEALTHY BLUE EFFECTIVE DATE - 07/23/19   PER [facility] [employer] S[facility] CPT CODES 06237 AND  62831 NO PRIOR AUTH IS REQUIRED.  TRACKING # [id number]

## 2020-08-08 ENCOUNTER — Other Ambulatory Visit: Payer: Self-pay

## 2020-08-08 ENCOUNTER — Encounter (HOSPITAL_BASED_OUTPATIENT_CLINIC_OR_DEPARTMENT_OTHER): Payer: Self-pay | Admitting: Podiatry

## 2020-08-08 NOTE — Progress Notes (Signed)
Spoke w/ via phone for pre-op interview---PT Lab needs dos----NONE            Lab results------NONE COVID test -----patient states asymptomatic no test needed Arrive at -------1000 NPO after MN NO Solid Food.  Clear liquids from MN until---0900 Med rec completed Medications to take morning of surgery ----- Diabetic medication NONE Patient instructed no nail polish to be worn day of surgery Patient instructed to bring photo id and insurance card day of surgery Patient aware to have Driver (ride ) / Riki Rusk PALMER caregiver    for 24 hours after surgery  Patient Special Instructions -----NONE Pre-Op special Istructions -----NONE Patient verbalized understanding of instructions that were given at this phone interview. Patient denies shortness of breath, chest pain, fever, cough at this phone interview.

## 2020-08-09 ENCOUNTER — Encounter: Payer: Medicaid Other | Admitting: Podiatry

## 2020-08-09 NOTE — Progress Notes (Addendum)
H & P Sherri jocelyn fnp dated 08-08-2020 on chart for 08-15-2020 surgery

## 2020-08-14 NOTE — Anesthesia Preprocedure Evaluation (Addendum)
Anesthesia Evaluation  Patient identified by MRN, date of birth, ID band Patient awake    Reviewed: Allergy & Precautions, NPO status , Patient's Chart, lab work & pertinent test results  Airway Mallampati: II  TM Distance: >3 FB Neck ROM: Full    Dental no notable dental hx. (+) Dental Advisory Given   Pulmonary Current Smoker and Patient abstained from smoking.,    Pulmonary exam normal breath sounds clear to auscultation       Cardiovascular Exercise Tolerance: Good Normal cardiovascular exam Rhythm:Regular Rate:Normal     Neuro/Psych    GI/Hepatic negative GI ROS, Neg liver ROS,   Endo/Other  diabetesMorbid obesity (BMI 40.85)  Renal/GU      Musculoskeletal   Abdominal (+) + obese,   Peds  Hematology   Anesthesia Other Findings   Reproductive/Obstetrics negative OB ROS                           Anesthesia Physical Anesthesia Plan  ASA: 3  Anesthesia Plan: General   Post-op Pain Management:  Regional for Post-op pain   Induction: Intravenous  PONV Risk Score and Plan: 3 and Treatment may vary due to age or medical condition, Midazolam and Ondansetron  Airway Management Planned: LMA  Additional Equipment: None  Intra-op Plan:   Post-operative Plan:   Informed Consent: I have reviewed the patients History and Physical, chart, labs and discussed the procedure including the risks, benefits and alternatives for the proposed anesthesia with the patient or authorized representative who has indicated his/her understanding and acceptance.     Dental advisory given  Plan Discussed with: CRNA  Anesthesia Plan Comments: (LMA w L popliteal block)      Anesthesia Quick Evaluation

## 2020-08-15 ENCOUNTER — Encounter (HOSPITAL_BASED_OUTPATIENT_CLINIC_OR_DEPARTMENT_OTHER): Payer: Self-pay | Admitting: Podiatry

## 2020-08-15 ENCOUNTER — Ambulatory Visit (HOSPITAL_BASED_OUTPATIENT_CLINIC_OR_DEPARTMENT_OTHER): Payer: Medicaid Other | Admitting: Anesthesiology

## 2020-08-15 ENCOUNTER — Encounter (HOSPITAL_BASED_OUTPATIENT_CLINIC_OR_DEPARTMENT_OTHER)
Admission: RE | Disposition: A | Discharge status: Home / Self Care | Payer: Self-pay | Source: Home / Self Care | Attending: Podiatry

## 2020-08-15 ENCOUNTER — Other Ambulatory Visit: Payer: Self-pay | Admitting: Podiatry

## 2020-08-15 ENCOUNTER — Ambulatory Visit (HOSPITAL_BASED_OUTPATIENT_CLINIC_OR_DEPARTMENT_OTHER)
Admission: RE | Admit: 2020-08-15 | Discharge: 2020-08-15 | Disposition: A | Discharge status: Home / Self Care | Payer: Medicaid Other | Attending: Podiatry | Admitting: Podiatry

## 2020-08-15 DIAGNOSIS — M216X2 Other acquired deformities of left foot: Secondary | ICD-10-CM | POA: Diagnosis not present

## 2020-08-15 DIAGNOSIS — M722 Plantar fascial fibromatosis: Secondary | ICD-10-CM | POA: Diagnosis not present

## 2020-08-15 HISTORY — DX: Anxiety disorder, unspecified: F41.9

## 2020-08-15 HISTORY — PX: GASTROC RECESSION EXTREMITY: SHX6262

## 2020-08-15 HISTORY — DX: Depression, unspecified: F32.A

## 2020-08-15 HISTORY — PX: PLANTAR FASCIA RELEASE: SHX2239

## 2020-08-15 LAB — POCT PREGNANCY, URINE: Preg Test, Ur: NEGATIVE

## 2020-08-15 SURGERY — FASCIOTOMY, PLANTAR, ENDOSCOPIC
Anesthesia: General | Site: Leg Lower | Laterality: Left

## 2020-08-15 MED ORDER — ACETAMINOPHEN 10 MG/ML IV SOLN: 1000.0000 mg | Freq: Once | INTRAVENOUS | Status: DC | PRN

## 2020-08-15 MED ORDER — OXYCODONE-ACETAMINOPHEN 5-325 MG PO TABS: 1.0000 | ORAL_TABLET | ORAL | 0 refills | Status: DC | PRN

## 2020-08-15 MED ORDER — MIDAZOLAM HCL 2 MG/2ML IJ SOLN
2.0000 mg | Freq: Once | INTRAMUSCULAR | Status: AC
  Administered 2020-08-15: 2 mg via INTRAVENOUS

## 2020-08-15 MED ORDER — LACTATED RINGERS IV SOLN: INTRAVENOUS | Status: DC

## 2020-08-15 MED ORDER — DEXMEDETOMIDINE (PRECEDEX) IN NS 20 MCG/5ML (4 MCG/ML) IV SYRINGE
PREFILLED_SYRINGE | INTRAVENOUS | Status: DC | PRN
  Administered 2020-08-15 (×2): 8 ug via INTRAVENOUS
  Administered 2020-08-15: 4 ug via INTRAVENOUS

## 2020-08-15 MED ORDER — OXYCODONE-ACETAMINOPHEN 10-325 MG PO TABS: 1.0000 | ORAL_TABLET | ORAL | 0 refills | Status: DC | PRN

## 2020-08-15 MED ORDER — FENTANYL CITRATE (PF) 100 MCG/2ML IJ SOLN
100.0000 ug | Freq: Once | INTRAMUSCULAR | Status: AC
  Administered 2020-08-15: 100 ug via INTRAVENOUS

## 2020-08-15 MED ORDER — OXYCODONE HCL 5 MG PO TABS
ORAL_TABLET | ORAL | Status: AC
  Filled 2020-08-15: qty 1

## 2020-08-15 MED ORDER — DEXMEDETOMIDINE (PRECEDEX) IN NS 20 MCG/5ML (4 MCG/ML) IV SYRINGE
PREFILLED_SYRINGE | INTRAVENOUS | Status: AC
  Filled 2020-08-15: qty 5

## 2020-08-15 MED ORDER — DEXAMETHASONE SODIUM PHOSPHATE 10 MG/ML IJ SOLN
INTRAMUSCULAR | Status: AC
  Filled 2020-08-15: qty 1

## 2020-08-15 MED ORDER — ONDANSETRON HCL 4 MG/2ML IJ SOLN
INTRAMUSCULAR | Status: AC
  Filled 2020-08-15: qty 2

## 2020-08-15 MED ORDER — SODIUM CHLORIDE 0.9 % IV SOLN
2.0000 g | Freq: Once | INTRAVENOUS | Status: AC
  Administered 2020-08-15: 2 g via INTRAVENOUS

## 2020-08-15 MED ORDER — SODIUM CHLORIDE 0.9 % IV SOLN
INTRAVENOUS | Status: AC
  Filled 2020-08-15: qty 2

## 2020-08-15 MED ORDER — SODIUM CHLORIDE 0.9 % IR SOLN
Status: DC | PRN
  Administered 2020-08-15: 500 mL

## 2020-08-15 MED ORDER — PROPOFOL 10 MG/ML IV BOLUS: INTRAVENOUS | Status: DC | PRN

## 2020-08-15 MED ORDER — BUPIVACAINE-EPINEPHRINE (PF) 0.5% -1:200000 IJ SOLN
INTRAMUSCULAR | Status: DC | PRN
  Administered 2020-08-15: 25 mL via PERINEURAL

## 2020-08-15 MED ORDER — MIDAZOLAM HCL 2 MG/2ML IJ SOLN
INTRAMUSCULAR | Status: AC
  Filled 2020-08-15: qty 2

## 2020-08-15 MED ORDER — HYDROMORPHONE HCL 1 MG/ML IJ SOLN
INTRAMUSCULAR | Status: AC
  Filled 2020-08-15: qty 1

## 2020-08-15 MED ORDER — PROPOFOL 500 MG/50ML IV EMUL
INTRAVENOUS | Status: AC
  Filled 2020-08-15: qty 50

## 2020-08-15 MED ORDER — PROPOFOL 10 MG/ML IV BOLUS
INTRAVENOUS | Status: DC | PRN
  Administered 2020-08-15: 200 mg via INTRAVENOUS
  Administered 2020-08-15: 40 mg via INTRAVENOUS
  Administered 2020-08-15: 60 mg via INTRAVENOUS

## 2020-08-15 MED ORDER — IBUPROFEN 800 MG PO TABS: 800.0000 mg | ORAL_TABLET | Freq: Four times a day (QID) | ORAL | 1 refills | Status: DC | PRN

## 2020-08-15 MED ORDER — OXYCODONE HCL 5 MG PO TABS
5.0000 mg | ORAL_TABLET | Freq: Once | ORAL | Status: AC | PRN
  Administered 2020-08-15: 5 mg via ORAL

## 2020-08-15 MED ORDER — DEXAMETHASONE SODIUM PHOSPHATE 10 MG/ML IJ SOLN
INTRAMUSCULAR | Status: DC | PRN
  Administered 2020-08-15: 4 mg via INTRAVENOUS

## 2020-08-15 MED ORDER — ONDANSETRON HCL 4 MG/2ML IJ SOLN
INTRAMUSCULAR | Status: DC | PRN
Start: 2020-08-15 — End: 2020-08-15
  Administered 2020-08-15: 4 mg via INTRAVENOUS

## 2020-08-15 MED ORDER — LIDOCAINE 2% (20 MG/ML) 5 ML SYRINGE
INTRAMUSCULAR | Status: DC | PRN
  Administered 2020-08-15: 60 mg via INTRAVENOUS
  Administered 2020-08-15: 40 mg via INTRAVENOUS

## 2020-08-15 MED ORDER — AMISULPRIDE (ANTIEMETIC) 5 MG/2ML IV SOLN: 10.0000 mg | Freq: Once | INTRAVENOUS | Status: DC | PRN

## 2020-08-15 MED ORDER — FENTANYL CITRATE (PF) 100 MCG/2ML IJ SOLN
INTRAMUSCULAR | Status: AC
  Filled 2020-08-15: qty 2

## 2020-08-15 MED ORDER — ONDANSETRON HCL 4 MG/2ML IJ SOLN: 4.0000 mg | Freq: Once | INTRAMUSCULAR | Status: DC | PRN

## 2020-08-15 MED ORDER — LIDOCAINE HCL (PF) 2 % IJ SOLN
INTRAMUSCULAR | Status: AC
  Filled 2020-08-15: qty 5

## 2020-08-15 MED ORDER — OXYCODONE HCL 5 MG/5ML PO SOLN
5.0000 mg | Freq: Once | ORAL | Status: AC | PRN
Start: 2020-08-15 — End: 2020-08-15

## 2020-08-15 MED ORDER — HYDROMORPHONE HCL 1 MG/ML IJ SOLN
0.2500 mg | INTRAMUSCULAR | Status: DC | PRN
  Administered 2020-08-15: 0.25 mg via INTRAVENOUS

## 2020-08-15 SURGICAL SUPPLY — 68 items
BLADE AVERAGE 25X9 (BLADE) ×3 IMPLANT
BLADE HOOK ENDO STRL (BLADE) IMPLANT
BLADE SURG 15 STRL LF DISP TIS (BLADE) ×4 IMPLANT
BLADE SURG 15 STRL SS (BLADE) ×6
BLADE TRIANGLE EPF/EGR ENDO (BLADE) ×3 IMPLANT
BNDG CMPR 9X4 STRL LF SNTH (GAUZE/BANDAGES/DRESSINGS) ×2
BNDG CONFORM 2 STRL LF (GAUZE/BANDAGES/DRESSINGS) ×3 IMPLANT
BNDG ELASTIC 3X5.8 VLCR STR LF (GAUZE/BANDAGES/DRESSINGS) ×3 IMPLANT
BNDG ELASTIC 4X5.8 VLCR STR LF (GAUZE/BANDAGES/DRESSINGS) ×3 IMPLANT
BNDG ELASTIC 6X5.8 VLCR STR LF (GAUZE/BANDAGES/DRESSINGS) IMPLANT
BNDG ESMARK 4X9 LF (GAUZE/BANDAGES/DRESSINGS) ×3 IMPLANT
BNDG GAUZE ELAST 4 BULKY (GAUZE/BANDAGES/DRESSINGS) ×3 IMPLANT
CLSR STERI-STRIP ANTIMIC 1/2X4 (GAUZE/BANDAGES/DRESSINGS) ×3 IMPLANT
COVER BACK TABLE 60X90IN (DRAPES) ×3 IMPLANT
CUFF TOURN SGL QUICK 18X4 (TOURNIQUET CUFF) IMPLANT
DRAPE C-ARM 35X43 STRL (DRAPES) IMPLANT
DRAPE C-ARMOR (DRAPES) IMPLANT
DRAPE EXTREMITY T 121X128X90 (DISPOSABLE) ×3 IMPLANT
DRAPE IMP U-DRAPE 54X76 (DRAPES) ×3 IMPLANT
DRAPE U-SHAPE 47X51 STRL (DRAPES) ×3 IMPLANT
DRSG ADAPTIC 3X8 NADH LF (GAUZE/BANDAGES/DRESSINGS) IMPLANT
DRSG EMULSION OIL 3X3 NADH (GAUZE/BANDAGES/DRESSINGS) ×3 IMPLANT
DURAPREP 26ML APPLICATOR (WOUND CARE) ×3 IMPLANT
ELECT REM PT RETURN 9FT ADLT (ELECTROSURGICAL) ×3
ELECTRODE REM PT RTRN 9FT ADLT (ELECTROSURGICAL) ×2 IMPLANT
GAUZE 4X4 16PLY ~~LOC~~+RFID DBL (SPONGE) ×9 IMPLANT
GAUZE SPONGE 4X4 12PLY STRL (GAUZE/BANDAGES/DRESSINGS) ×3 IMPLANT
GAUZE SPONGE 4X4 12PLY STRL LF (GAUZE/BANDAGES/DRESSINGS) ×3 IMPLANT
GAUZE XEROFORM 1X8 LF (GAUZE/BANDAGES/DRESSINGS) ×3 IMPLANT
GLOVE SURG ENC MOIS LTX SZ7 (GLOVE) ×6 IMPLANT
GLOVE SURG UNDER POLY LF SZ7.5 (GLOVE) ×6 IMPLANT
GOWN STRL REUS W/TWL LRG LVL3 (GOWN DISPOSABLE) ×6 IMPLANT
KIT TURNOVER CYSTO (KITS) ×3 IMPLANT
NDL SAFETY ECLIPSE 18X1.5 (NEEDLE) IMPLANT
NEEDLE HYPO 18GX1.5 SHARP (NEEDLE)
NEEDLE HYPO 25X1 1.5 SAFETY (NEEDLE) ×6 IMPLANT
NS IRRIG 1000ML POUR BTL (IV SOLUTION) ×3 IMPLANT
PACK BASIN DAY SURGERY FS (CUSTOM PROCEDURE TRAY) ×3 IMPLANT
PADDING CAST ABS 4INX4YD NS (CAST SUPPLIES) ×1
PADDING CAST ABS COTTON 4X4 ST (CAST SUPPLIES) ×2 IMPLANT
PENCIL SMOKE EVACUATOR (MISCELLANEOUS) ×3 IMPLANT
SLEEVE SCD COMPRESS KNEE MED (STOCKING) IMPLANT
STOCKINETTE 6  STRL (DRAPES) ×1
STOCKINETTE 6 STRL (DRAPES) ×2 IMPLANT
STRIP CLOSURE SKIN 1/2X4 (GAUZE/BANDAGES/DRESSINGS) IMPLANT
SUCTION FRAZIER HANDLE 10FR (MISCELLANEOUS) ×1
SUCTION TUBE FRAZIER 10FR DISP (MISCELLANEOUS) ×2 IMPLANT
SUT ETHILON 3 0 PS 1 (SUTURE) IMPLANT
SUT ETHILON 5 0 PS 2 18 (SUTURE) IMPLANT
SUT MERSILENE 2.0 SH NDLE (SUTURE) ×3 IMPLANT
SUT MERSILENE 3 0 FS 1 (SUTURE) IMPLANT
SUT MNCRL AB 3-0 PS2 18 (SUTURE) ×3 IMPLANT
SUT MNCRL AB 4-0 PS2 18 (SUTURE) IMPLANT
SUT MON AB 2-0 SH 27 (SUTURE)
SUT MON AB 2-0 SH27 (SUTURE) IMPLANT
SUT MON AB 3-0 SH 27 (SUTURE)
SUT MON AB 3-0 SH27 (SUTURE) IMPLANT
SUT MON AB 4-0 PS1 27 (SUTURE) IMPLANT
SUT MON AB 5-0 PS2 18 (SUTURE) ×3 IMPLANT
SUT PROLENE 3 0 PS 2 (SUTURE) IMPLANT
SUT VIC AB 2-0 SH 27 (SUTURE)
SUT VIC AB 2-0 SH 27XBRD (SUTURE) IMPLANT
SUT VIC AB 3-0 SH 27 (SUTURE)
SUT VIC AB 3-0 SH 27X BRD (SUTURE) IMPLANT
SYR 10ML LL (SYRINGE) ×6 IMPLANT
SYR BULB EAR ULCER 3OZ GRN STR (SYRINGE) ×3 IMPLANT
TOWEL OR 17X26 10 PK STRL BLUE (TOWEL DISPOSABLE) ×3 IMPLANT
UNDERPAD 30X36 HEAVY ABSORB (UNDERPADS AND DIAPERS) ×3 IMPLANT

## 2020-08-15 NOTE — Anesthesia Postprocedure Evaluation (Signed)
Anesthesia Post Note  Patient: Dharma Pare  Procedure(s) Performed: ENDOSCOPIC PLANTAR FASCIOTOMY (Left: Foot) GASTROC RECESSION EXTREMITY (Left: Leg Lower)     Patient location during evaluation: PACU Anesthesia Type: General and Regional Level of consciousness: awake and alert Pain management: pain level controlled Vital Signs Assessment: post-procedure vital signs reviewed and stable Respiratory status: spontaneous breathing, nonlabored ventilation, respiratory function stable and patient connected to nasal cannula oxygen Cardiovascular status: blood pressure returned to baseline and stable Postop Assessment: no apparent nausea or vomiting Anesthetic complications: no   No notable events documented.  Last Vitals:  Vitals:   08/15/20 1415 08/15/20 1535  BP: 121/80 137/85  Pulse: 86 87  Resp: (!) 21 15  Temp:  36.6 C  SpO2: 98% 99%    Last Pain:  Vitals:   08/15/20 1535  TempSrc:   PainSc: 1                  Trevor Iha

## 2020-08-15 NOTE — Progress Notes (Signed)
AssistedDr. Houser with left, ultrasound guided, popliteal/saphenous block. Side rails up, monitors on throughout procedure. See vital signs in flow sheet. Tolerated Procedure well.  

## 2020-08-15 NOTE — Anesthesia Procedure Notes (Signed)
Anesthesia Regional Block: Popliteal block   Pre-Anesthetic Checklist: , timeout performed,  Correct Patient, Correct Site, Correct Laterality,  Correct Procedure, Correct Position, site marked,  Risks and benefits discussed,  Pre-op evaluation,  At surgeon's request and post-op pain management  Laterality: Lower and Left  Prep: Maximum Sterile Barrier Precautions used, chloraprep       Needles:  Injection technique: Single-shot  Needle Type: Echogenic Needle     Needle Length: 9cm  Needle Gauge: 21     Additional Needles:   Procedures:,,,, ultrasound used (permanent image in chart),,    Narrative:  Start time: 08/15/2020 12:15 PM End time: 08/15/2020 12:25 PM Injection made incrementally with aspirations every 5 mL.  Performed by: Personally  Anesthesiologist: Trevor Iha, MD  Additional Notes: Block assessed. Patient tolerated procedure well.

## 2020-08-15 NOTE — Interval H&P Note (Signed)
History and Physical Interval Note:  08/15/2020 12:27 PM  Sherri Jacobson  has presented today for surgery, with the diagnosis of PLANTAR FASCITITIS.  The various methods of treatment have been discussed with the patient and family. After consideration of risks, benefits and other options for treatment, the patient has consented to  Procedure(s) with comments: ENDOSCOPIC PLANTAR FASCIOTOMY (Left) - POP BLOCK GASTROC RECESSION EXTREMITY (Left) as a surgical intervention.  The patient's history has been reviewed, patient examined, no change in status, stable for surgery.  I have reviewed the patient's chart and labs.  Questions were answered to the patient's satisfaction.     Candelaria Stagers

## 2020-08-15 NOTE — Progress Notes (Signed)
Orthopedic Tech Progress Note Patient Details:  Sherri Jacobson [birth year] [id number]  Ortho Devices Type of Ortho Device: CAM walker Ortho Device/Splint Location: left Ortho Device/Splint Interventions: Application   Post Interventions Patient Tolerated: Well Instructions Provided: Care of device, Adjustment of device  Saul Fordyce 08/15/2020, 2:25 PM

## 2020-08-15 NOTE — Discharge Instructions (Addendum)
After Surgery Instructions   1) If you are recuperating from surgery anywhere other than home, please be sure to leave us the number where you can be reached.  2) Go directly home and rest.  3) Keep the operated foot(feet) elevated six inches above the hip when sitting or lying down. This will help control swelling and pain.  4) Support the elevated foot and leg with pillows. DO NOT PLACE PILLOWS UNDER THE KNEE.  5) DO NOT REMOVE or get your bandages WET, unless you were given different instructions by your doctor to do so. This increases the risk of infection.  6) Wear your surgical shoe or surgical boot at all times when you are up on your feet.  7) A limited amount of pain and swelling may occur. The skin may take on a bruised appearance. DO NOT BE ALARMED, THIS IS NORMAL.  8) For slight pain and swelling, apply an ice pack directly over the bandages for 15 minutes only out of each hour of the day. Continue until seen in the office for your first post op visit. DON NOT     APPLY ANY FORM OF HEAT TO THE AREA.  9) Have prescriptions filled immediately and take as directed.  10) Drink lots of liquids, water and juice to stay hydrated.  11) CALL IMMEDIATELY IF:  *Bleeding continues until the following day of surgery  *Pain increases and/or does not respond to medication  *Bandages or cast appears to tight  *If your bandage gets wet  *Trip, fall or stump your surgical foot  *If your temperature goes above 101  *If you have ANY questions at all  YOU NOW CONTROL THE EFFORT OF YOUR RECOVERY. ADHERING TO THESE INSTRUCTIONS WILL OFFER YOU THE MOST COMPLETE RESULTS    Post Anesthesia Home Care Instructions  Activity: Get plenty of rest for the remainder of the day. A responsible individual must stay with you for 24 hours following the procedure.  For the next 24 hours, DO NOT: -Drive a car -Operate machinery -Drink alcoholic beverages -Take any medication unless instructed by your  physician -Make any legal decisions or sign important papers.  Meals: Start with liquid foods such as gelatin or soup. Progress to regular foods as tolerated. Avoid greasy, spicy, heavy foods. If nausea and/or vomiting occur, drink only clear liquids until the nausea and/or vomiting subsides. Call your physician if vomiting continues.  Special Instructions/Symptoms: Your throat may feel dry or sore from the anesthesia or the breathing tube placed in your throat during surgery. If this causes discomfort, gargle with warm salt water. The discomfort should disappear within 24 hours.       

## 2020-08-15 NOTE — Progress Notes (Signed)
Assisted Dr. Houser with left, ultrasound guided, popliteal block. Side rails up, monitors on throughout procedure. See vital signs in flow sheet. Tolerated Procedure well. 

## 2020-08-15 NOTE — H&P (View-Only) (Signed)
Assisted Dr. Houser with left, ultrasound guided, popliteal block. Side rails up, monitors on throughout procedure. See vital signs in flow sheet. Tolerated Procedure well. 

## 2020-08-15 NOTE — Anesthesia Procedure Notes (Signed)
Procedure Name: LMA Insertion Date/Time: 08/15/2020 12:45 PM Performed by: Norva Pavlov, CRNA Pre-anesthesia Checklist: Patient identified, Emergency Drugs available, Suction available and Patient being monitored Patient Re-evaluated:Patient Re-evaluated prior to induction Oxygen Delivery Method: Circle system utilized Preoxygenation: Pre-oxygenation with 100% oxygen Induction Type: IV induction Ventilation: Mask ventilation without difficulty LMA: LMA inserted LMA Size: 4.0 Number of attempts: 1 Airway Equipment and Method: Bite block Placement Confirmation: positive ETCO2 Tube secured with: Tape Dental Injury: Teeth and Oropharynx as per pre-operative assessment

## 2020-08-15 NOTE — Transfer of Care (Signed)
Immediate Anesthesia Transfer of Care Note  Patient: Sherri Jacobson  Procedure(s) Performed: Procedure(s) (LRB): ENDOSCOPIC PLANTAR FASCIOTOMY (Left) GASTROC RECESSION EXTREMITY (Left)  Patient Location: PACU  Anesthesia Type: General  Level of Consciousness: awake, alert  and oriented  Airway & Oxygen Therapy: Patient Spontanous Breathing and on room air  Post-op Assessment: Report given to PACU RN and Post -op Vital signs reviewed and stable  Post vital signs: Reviewed and stable  Complications: No apparent anesthesia complications  Last Vitals:  Vitals Value Taken Time  BP 135/81 08/15/20 1337  Temp    Pulse 98 08/15/20 1343  Resp 16 08/15/20 1343  SpO2 94 % 08/15/20 1343  Vitals shown include unvalidated device data.  Last Pain:  Vitals:   08/15/20 1032  TempSrc: Oral  PainSc: 4       Patients Stated Pain Goal: 5 (08/15/20 1032)  Complications: No notable events documented.

## 2020-08-16 ENCOUNTER — Encounter (HOSPITAL_BASED_OUTPATIENT_CLINIC_OR_DEPARTMENT_OTHER): Payer: Self-pay | Admitting: Podiatry

## 2020-08-17 NOTE — Op Note (Signed)
Surgeon: Surgeon(s): Candelaria Stagers, DPM  Assistants: None Pre-operative diagnosis: PLANTAR FASCITITIS  Post-operative diagnosis: same Procedure: Procedure(s) (LRB): ENDOSCOPIC PLANTAR FASCIOTOMY (Left) GASTROC RECESSION EXTREMITY (Left)  Pathology: * No specimens in log *  Pertinent Intra-op findings: Positive Silfverskiold test with gastrocnemius equinus and thickening of the plantar fashion noted Anesthesia: Choice  Hemostasis:  Total Tourniquet Time Documented: Thigh (Left) - 25 minutes Total: Thigh (Left) - 25 minutes  EBL: 0 mL  Materials: 3-0 Prolene Injectables: None Complications: None  Indications for surgery: A 40 y.o. female presents with left painful Planter fasciitis with gastrocnemius equinus. Patient has failed all conservative therapy including but not limited to injection and cam boot immobilization. She wishes to have surgical correction of the foot/deformity. It was determined that patient would benefit from left endoscopic plantar fasciotomy with gastrocnemius recession. Informed surgical risk consent was reviewed and read aloud to the patient.  I reviewed the films.  I have discussed my findings with the patient in great detail.  I have discussed all risks including but not limited to infection, stiffness, scarring, limp, disability, deformity, damage to blood vessels and nerves, numbness, poor healing, need for braces, arthritis, chronic pain, amputation, death.  All benefits and realistic expectations discussed in great detail.  I have made no promises as to the outcome.  I have provided realistic expectations.  I have offered the patient a 2nd opinion, which they have declined and assured me they preferred to proceed despite the risks   Procedure in detail: The patient was both verbally and visually identified by myself, the nursing staff, and anesthesia staff in the preoperative holding area. They were then transferred to the operating room and placed on the  operative table in supine position.   The patient was educated on the risks and complications of the surgical intervention which are  included but not limited to bleeding infection scarring numbness possible additional surgery overcorrection  under correction loss of limb and loss of life. The patient did understand just prior to surgeon did accept him  there were no promises or guarantees as the outcome of the surgery that we give the patient preoperatively. The  patient both verbally agreed as well as signed the consent which is in place in the patient's chart. Procedure: The patient was brought to the preoperative area today and the operative site was marked with  indelible pen. Under mild sedation the patient was brought to the operating room placed on the operating room  table in a supine position. Timeout was called in the presence of the operating room team and all they were  present were in agreement on the procedure patient's name planned procedure location the procedure. A  pneumatic thigh tourniquet to the right thigh was placed after IV sedation was obtained utilizing 10 cc of high  percent Marcaine plain across the medial aspect of the calf muscle superior to the incision of the gastroc  recession. This was in addition to a popliteal block performed by anesthesia. The foot was then scrubbed  prepped and draped in the usual aseptic manner. An Esmarch bandage was then utilized to exsanguinate the  patient's right foot and ankle and the pneumatic ankle tourniquet was then inflated. Attention was then drawn to the medial aspect of the lower leg where a longitudinal incision was made at the  myotendinous juncture of the gastrocnemius and soleus. A clean #15 blade was used to make the incision down  to the level of subcuticular tissue. After which a  combination of sharp and blunt dissection was used to dissect  through the subcuticular tissue retracting all vital neurovascular structures as  necessary. Once down to the deep  fascia a clean #15 blade was then used to make a longitudinal incision exposing the soleus muscle belly and the  myotendinous junction of the gastrocnemius. Blunt dissection was used to dissect through the fascial covering of  the gastrocnemius myotendinous junction and blunt dissection was used to isolate the soleus muscle from the  gastrocnemius junction. At this time a scissor and hemostat was used to cut the myotendinous junction of the  gastrocnemius only leaving the soleus as the main attachment for the myotendinous junction of the Achilles  complex. This was performed in a medial to lateral fashion staying within the deep fascia the entire time. It is  important to note that the intramuscular septum was also cut in order to release tension off of the myotendinous  junction. The silverskoild exam was performed on the operating room table after the incision to the  myotendinous junction was performed and found to be adequate in nature correcting the equinus that was there  preoperatively. The incision site was then washed with copious muscle saline and closed in a deep to superficial  manner first with 3-0 monocryl reapproximating the deep fascia after which 3-0 monocryl was then used to  reapproximate the subcuticular tissue after which 3-0 nylon was used to reapproximate the skin edges in a  simple suture technique. Attention was then drawn to the medial aspect of the calcaneus where a longitudinal  incision was made approximately 1 to 3 cm distal to the medial calcaneal tuberosity and approximately 17 mm  superior from the inferior edge of the skin at the level of the plantar fascia. After the incision was made blunt  dissection was used to dissect through subcuticular tissue down to the level of the plantar fascia. An obturator  and cannula were introduced across the plantar aspect of the fascia between the subcuticular tissue and the  fascia from in a  medial to lateral fashion. Once the operator was felt on the lateral aspect of the calcaneus a  small incision was made allowing the obturator to poke through the remainder of the skin and passed from a  medial lateral manner all the way through each incision hugging the plantar fascia. The obturator was removed leaving the cannula only the camera was then introduced in a medial to lateral fashion visualizing the plantar  fascia. At this point a knife was then introduced in a lateral to medial fashion visualized with the camera after  which a an incision was made through the plantar fascia exposing the muscle belly beneath any mediolateral  fashion approximately one half of the way from the medial side of the foot to the central aspect of the central  band of the plantar fascia. Again this is visualized under the guidance of arthroscopy. Once this was performed  and found to be adequate in nature the knife was removed from the incision site the obturator was reintroduced  into the cannula and the cannula and obturator together were removed from the incision site the incision site  was then washed with copious muscle saline and closed in a deep to superficial fascia manner first with 3-0 nylon  to the skin and a simple suture technique. Upon completion of the procedure the incision site was dressed with  Betadine soaked Adaptic and covered with sterile compressive dressings consisting of 4 x 4  and Kerlix. The  pneumatic thigh tourniquet was deflated with a prompt hyperemic response noted all digits of the right foot.  Web roll and Ace were then applied. Sponges and instruments were counted and all were accounted for. Postop: The patient tolerated the procedure and anesthesia well, the patient was transferred back to recovery  room vital signs stable vessels as intact all toes of the right foot. Following. Postop monitoring the patient will be  discharged home on the following written and oral postop  instructions #1 keep the dressings clean dry and intact  #2 ice and elevate the affected limb when at rest #3 wear the surgical boot at all times #4 take medications as  directed #5 the patient is to remain weightbearing as tolerated to the right lower extremity within the boot. #6  patient will follow up with in my office within 1 week's time discharge.  At the conclusion of the procedure the patient was awoken from anesthesia and found to have tolerated the procedure well any complications. There were transferred to PACU with vital signs stable and vascular status intact.  Nicholes Rough, DPM

## 2020-08-23 ENCOUNTER — Encounter: Payer: Self-pay | Admitting: Podiatry

## 2020-08-23 ENCOUNTER — Other Ambulatory Visit: Payer: Self-pay

## 2020-08-23 ENCOUNTER — Encounter: Payer: Medicaid Other | Admitting: Podiatry

## 2020-08-23 ENCOUNTER — Ambulatory Visit (INDEPENDENT_AMBULATORY_CARE_PROVIDER_SITE_OTHER): Payer: Medicaid Other | Admitting: Podiatry

## 2020-08-23 VITALS — Temp 98.6°F

## 2020-08-23 DIAGNOSIS — M216X2 Other acquired deformities of left foot: Secondary | ICD-10-CM

## 2020-08-23 DIAGNOSIS — Z9889 Other specified postprocedural states: Secondary | ICD-10-CM

## 2020-08-23 DIAGNOSIS — M722 Plantar fascial fibromatosis: Secondary | ICD-10-CM

## 2020-08-23 DIAGNOSIS — M21862 Other specified acquired deformities of left lower leg: Secondary | ICD-10-CM

## 2020-08-23 NOTE — Progress Notes (Signed)
  Subjective:  Patient ID: Sherri Jacobson, female    DOB: 04-11-1980,  MRN: [id number]  Chief Complaint  Patient presents with   Routine Post Op    "So good, so far!  Out of all this the only thing I have to complain about is this numb tongue from the anesthesia.  The pain is less this time.  I'm ready to try to walk.  I had to re-bandage it."    DOS: 08/15/2020 Procedure: Left endoscopic plantar fasciotomy with gastrocnemius recession  40 y.o. female returns for post-op check.  Patient states she is doing well.  Incisions intact.  No signs of infection noted.  She has been nonweightbearing to the left lower extremity.  I encouraged her to start walking with shoes/boot on.  Review of Systems: Negative except as noted in the HPI. Denies N/V/F/Ch.  Past Medical History:  Diagnosis Date   Anxiety    Bipolar disease during pregnancy in first trimester (HCC) 03/19/2016   Depression    Encounter for procreative genetic counseling 04/03/2016   Overview:  Genetic counseling visit on 04/10/2016  Aneuploidy screening/ testing:  First trimester screening; results negative  Carrier screening:  Genetic counseling visit pending    Current Outpatient Medications:    fexofenadine (ALLEGRA) 180 MG tablet, Take 180 mg by mouth daily., Disp: , Rfl:    ibuprofen (ADVIL) 800 MG tablet, Take 1 tablet (800 mg total) by mouth every 6 (six) hours as needed., Disp: 60 tablet, Rfl: 1   oxyCODONE-acetaminophen (PERCOCET) 10-325 MG tablet, Take 1 tablet by mouth every 4 (four) hours as needed for pain., Disp: 30 tablet, Rfl: 0   oxyCODONE-acetaminophen (PERCOCET) 5-325 MG tablet, Take 1-2 tablets by mouth every 4 (four) hours as needed for severe pain., Disp: 30 tablet, Rfl: 0   tiZANidine (ZANAFLEX) 4 MG tablet, TAKE 1 TO 2 TABLETS BY MOUTH EVERY NIGHT AT BEDTIME AS NEEDED FOR INSOMNIA, Disp: , Rfl:    traMADol (ULTRAM) 50 MG tablet, Take by mouth every 6 (six) hours as needed. (Patient not taking: Reported on  08/23/2020), Disp: , Rfl:   Social History   Tobacco Use  Smoking Status Every Day   Packs/day: 0.25   Years: 12.00   Pack years: 3.00   Types: Cigarettes  Smokeless Tobacco Never    No Known Allergies Objective:   Vitals:   08/23/20 1401  Temp: 98.6 F (37 C)   There is no height or weight on file to calculate BMI. Constitutional Well developed. Well nourished.  Vascular Foot warm and well perfused. Capillary refill normal to all digits.   Neurologic Normal speech. Oriented to person, place, and time. Epicritic sensation to light touch grossly present bilaterally.  Dermatologic Skin healing well without signs of infection. Skin edges well coapted without signs of infection.  Orthopedic: Tenderness to palpation noted about the surgical site.   Radiographs: None Assessment:   1. Plantar fasciitis, left   2. Gastrocnemius equinus, left   3. Status post foot surgery    Plan:  Patient was evaluated and treated and all questions answered.  S/p foot surgery left -Progressing as expected post-operatively. -XR: See above -WB Status: Weightbearing as tolerated in cam boot -Sutures: Intact no clinical signs of dehiscence noted.  No complication noted.. -Medications: None -Foot redressed.  No follow-ups on file.

## 2020-08-26 ENCOUNTER — Telehealth: Payer: Self-pay | Admitting: Podiatry

## 2020-08-26 MED ORDER — OXYCODONE-ACETAMINOPHEN 5-325 MG PO TABS: 1.0000 | ORAL_TABLET | ORAL | 0 refills | Status: AC | PRN

## 2020-08-26 NOTE — Telephone Encounter (Signed)
"  I'm calling because Dr. Allena Katz told me if I needed another prescription to call him.  At first I didn't need it but I've been walking on it and it's getting too painful. I couldn't call the pharmacy because it said no refills."

## 2020-08-26 NOTE — Telephone Encounter (Signed)
Patient called stating she needs pain medication. Pharmacy [facility] and [address] Patients number is [phone].

## 2020-08-26 NOTE — Addendum Note (Signed)
Addended by: Nicholes Rough on: 08/26/2020 02:40 PM   Modules accepted: Orders

## 2020-09-06 ENCOUNTER — Ambulatory Visit (INDEPENDENT_AMBULATORY_CARE_PROVIDER_SITE_OTHER): Payer: Medicaid Other | Admitting: Podiatry

## 2020-09-06 ENCOUNTER — Other Ambulatory Visit: Payer: Self-pay

## 2020-09-06 DIAGNOSIS — M21862 Other specified acquired deformities of left lower leg: Secondary | ICD-10-CM

## 2020-09-06 DIAGNOSIS — M216X2 Other acquired deformities of left foot: Secondary | ICD-10-CM

## 2020-09-06 DIAGNOSIS — M722 Plantar fascial fibromatosis: Secondary | ICD-10-CM

## 2020-09-06 DIAGNOSIS — Z9889 Other specified postprocedural states: Secondary | ICD-10-CM

## 2020-09-06 NOTE — Progress Notes (Signed)
  Subjective:  Patient ID: Sherri Jacobson, female    DOB: 11-18-1980,  MRN: [id number]  Chief Complaint  Patient presents with   Routine Post Op    POS DOS 7.25.22    DOS: 08/15/2020 Procedure: Left endoscopic plantar fasciotomy with gastrocnemius recession  40 y.o. female returns for post-op check.  Patient states she is doing well.  Incisions intact.  No signs of infection noted.  She has been to the left lower extremity with Cam boot.  I encouraged her to start walking with shoes/boot on.  Review of Systems: Negative except as noted in the HPI. Denies N/V/F/Ch.  Past Medical History:  Diagnosis Date   Anxiety    Bipolar disease during pregnancy in first trimester (HCC) 03/19/2016   Depression    Encounter for procreative genetic counseling 04/03/2016   Overview:  Genetic counseling visit on 04/10/2016  Aneuploidy screening/ testing:  First trimester screening; results negative  Carrier screening:  Genetic counseling visit pending    Current Outpatient Medications:    fexofenadine (ALLEGRA) 180 MG tablet, Take 180 mg by mouth daily., Disp: , Rfl:    ibuprofen (ADVIL) 800 MG tablet, Take 1 tablet (800 mg total) by mouth every 6 (six) hours as needed., Disp: 60 tablet, Rfl: 1   oxyCODONE-acetaminophen (PERCOCET) 10-325 MG tablet, Take 1 tablet by mouth every 4 (four) hours as needed for pain., Disp: 30 tablet, Rfl: 0   oxyCODONE-acetaminophen (PERCOCET) 5-325 MG tablet, Take 1-2 tablets by mouth every 4 (four) hours as needed for severe pain., Disp: 30 tablet, Rfl: 0   tiZANidine (ZANAFLEX) 4 MG tablet, TAKE 1 TO 2 TABLETS BY MOUTH EVERY NIGHT AT BEDTIME AS NEEDED FOR INSOMNIA, Disp: , Rfl:    traMADol (ULTRAM) 50 MG tablet, Take by mouth every 6 (six) hours as needed. (Patient not taking: Reported on 08/23/2020), Disp: , Rfl:   Social History   Tobacco Use  Smoking Status Every Day   Packs/day: 0.25   Years: 12.00   Pack years: 3.00   Types: Cigarettes  Smokeless Tobacco Never     No Known Allergies Objective:   There were no vitals filed for this visit.  There is no height or weight on file to calculate BMI. Constitutional Well developed. Well nourished.  Vascular Foot warm and well perfused. Capillary refill normal to all digits.   Neurologic Normal speech. Oriented to person, place, and time. Epicritic sensation to light touch grossly present bilaterally.  Dermatologic Skin completely epithelialized.  No signs of dehiscence noted.  Ankle range of motion at 10 degrees past neutral.  Orthopedic: Tenderness to palpation noted about the surgical site.   Radiographs: None Assessment:   1. Plantar fasciitis, left   2. Gastrocnemius equinus, left   3. Status post foot surgery     Plan:  Patient was evaluated and treated and all questions answered.  S/p foot surgery left -Progressing as expected post-operatively. -XR: See above -WB Status: Weightbearing as tolerated in cam boot -Sutures: Removed no clinical signs of dehiscence noted.  No complication noted.. -Medications: None -Good correction alignment noted.  I encouraged doing physical therapy.  Patient would like to do physical therapy at home.  No follow-ups on file.

## 2020-10-11 ENCOUNTER — Encounter: Payer: Medicaid Other | Admitting: Podiatry

## 2021-04-18 ENCOUNTER — Encounter: Payer: Medicaid Other | Admitting: Women's Health

## 2021-05-10 ENCOUNTER — Encounter: Payer: Medicaid Other | Admitting: Obstetrics & Gynecology

## 2021-06-05 ENCOUNTER — Encounter: Payer: Medicaid Other | Admitting: Obstetrics & Gynecology

## 2021-08-05 IMAGING — US US EXTREM LOW VENOUS*R*
1 series · 14 of 24 positions shown · non-contrast
Comparison: None.

CLINICAL DATA: Right leg pain. Surgery 8 weeks ago for plantar
fasciitis.

EXAM:
RIGHT LOWER EXTREMITY VENOUS DOPPLER ULTRASOUND
TECHNIQUE: Gray-scale sonography with compression, as well as color and duplex
ultrasound, were performed to evaluate the deep venous system(s)
from the level of the common femoral vein through the popliteal and
proximal calf veins.

[Series 1: us venous img lower uni right (dvt) · portal-venous · 14 of 35 slices shown]
[im 1/35]
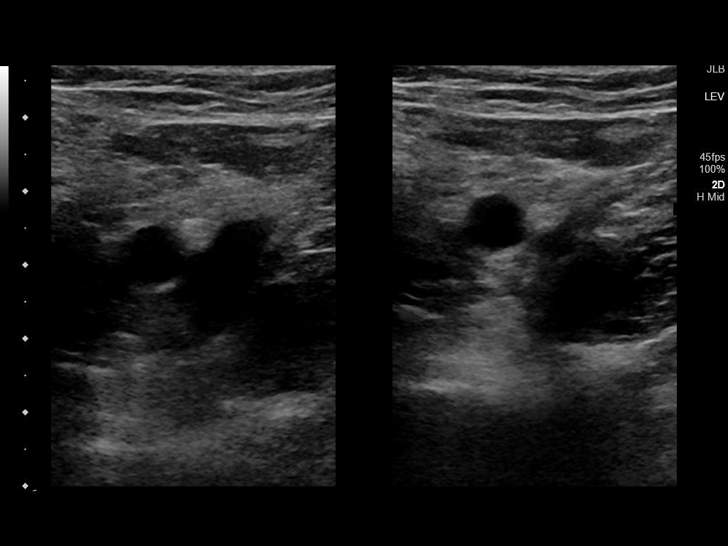
[im 3/35]
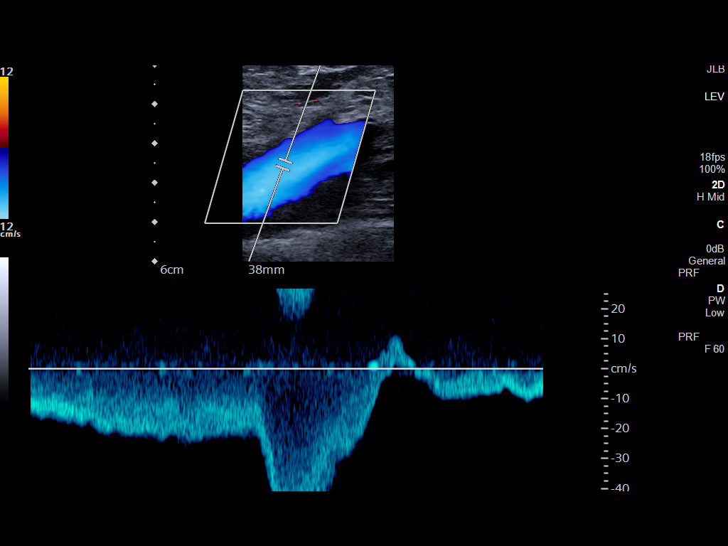
[im 6/35]
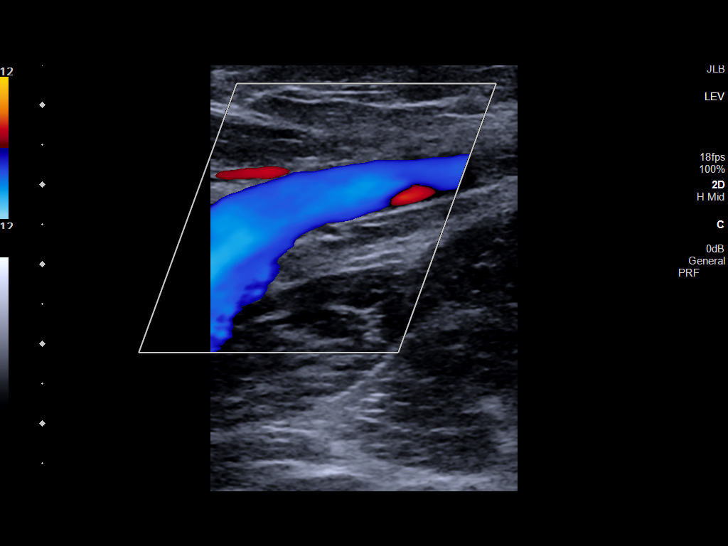
[im 9/35]
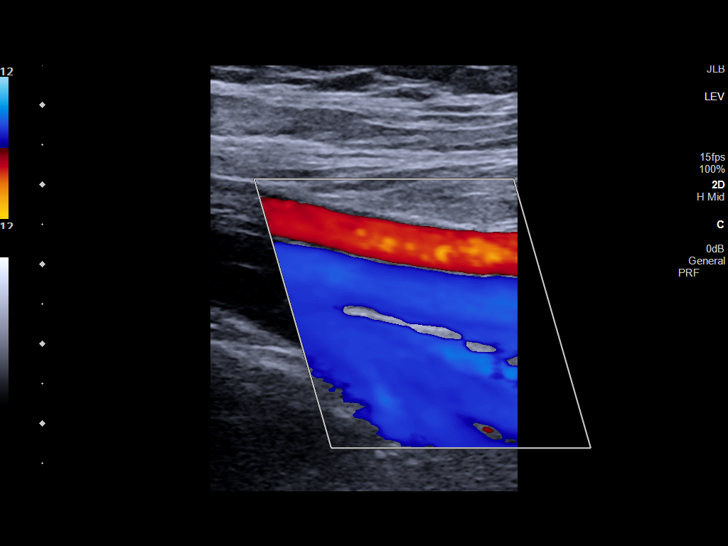
[im 11/35]
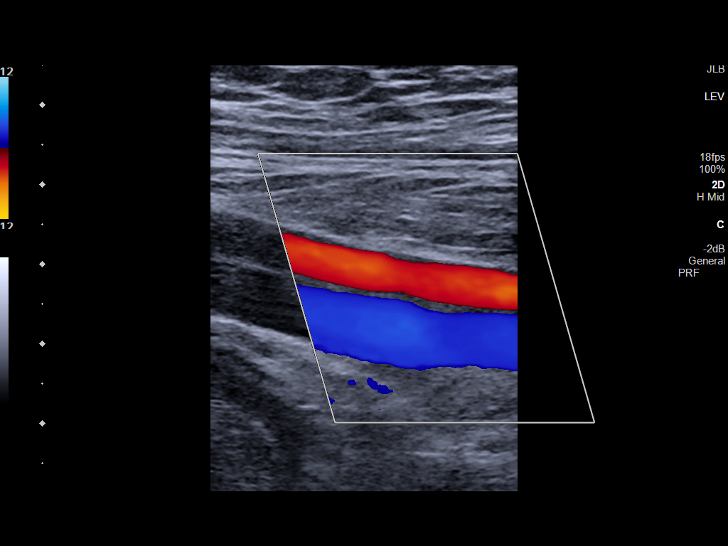
[im 14/35]
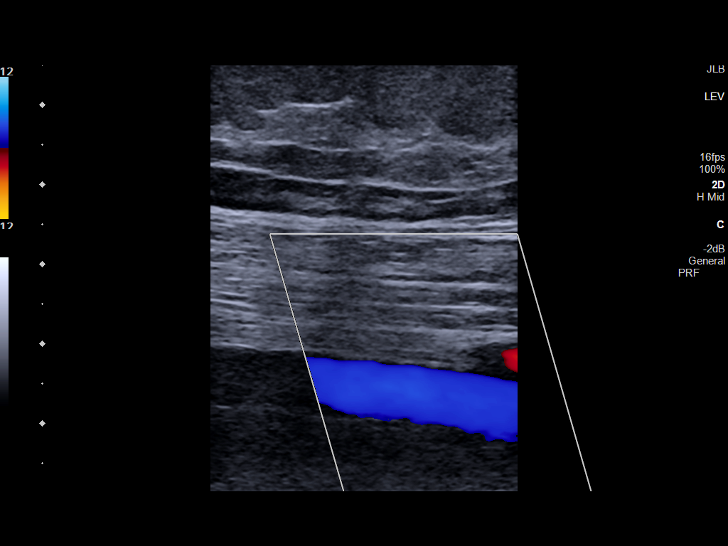
[im 17/35]
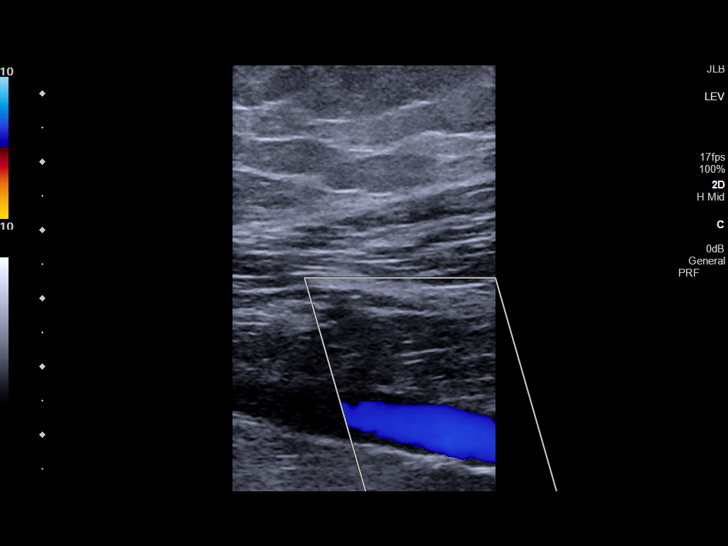
[im 18/35]
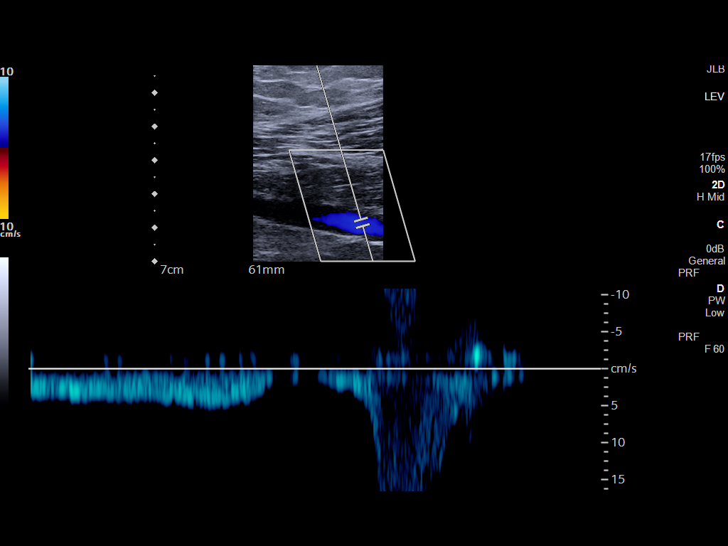
[im 21/35]
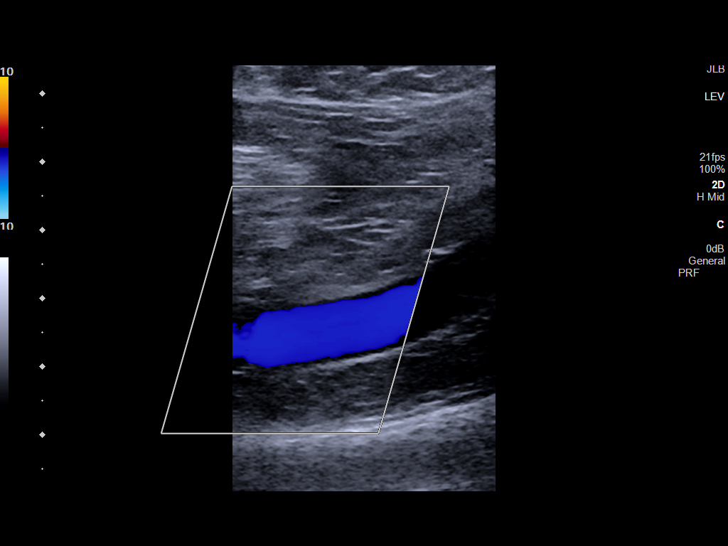
[im 24/35]
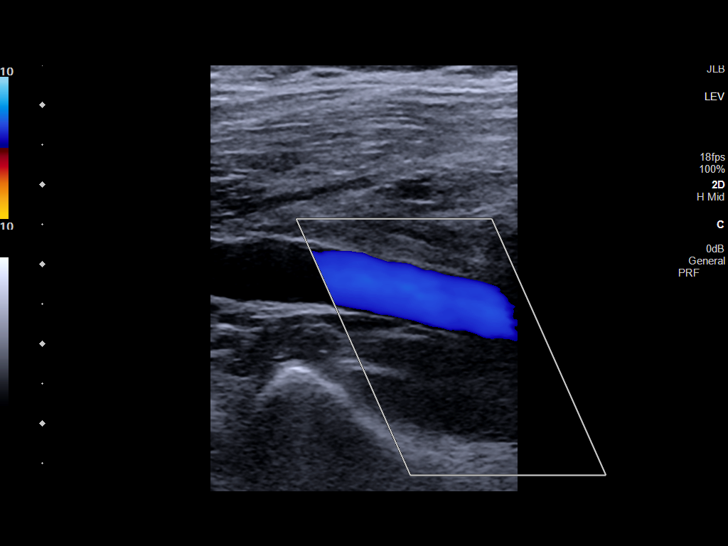
[im 27/35]
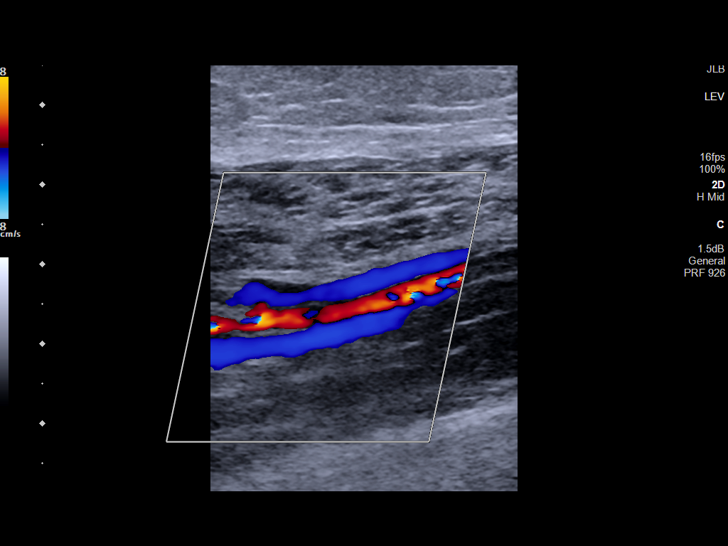
[im 29/35]
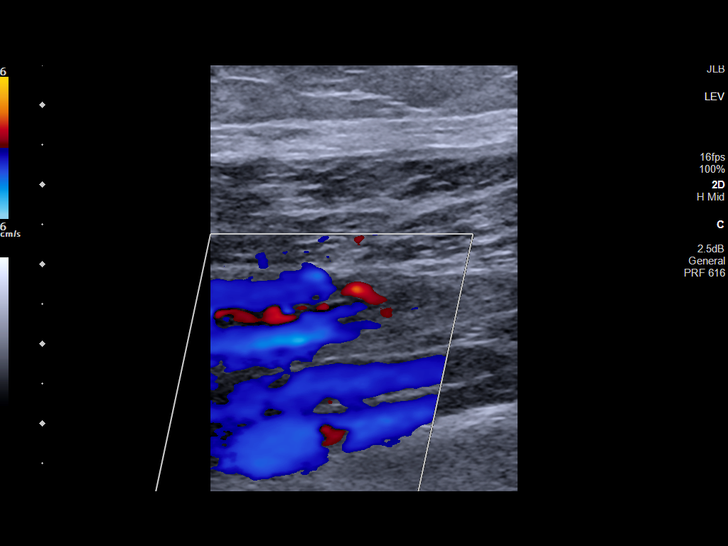
[im 32/35]
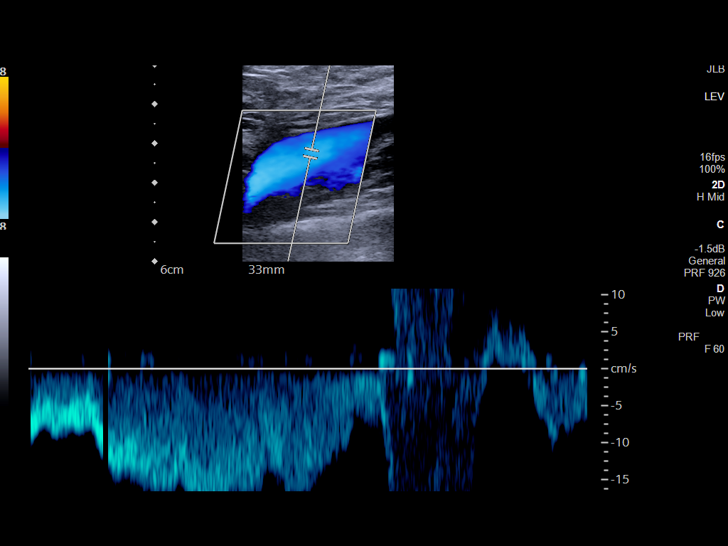
[im 35/35]
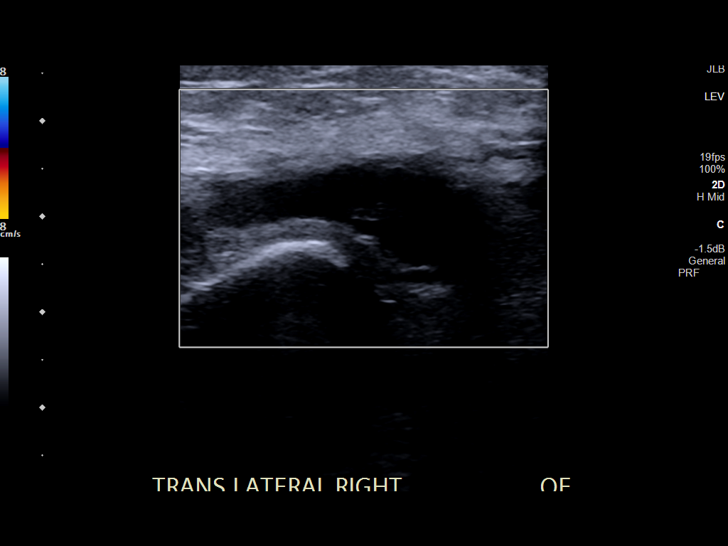

[14 of 24 positions shown; findings below may reference images not displayed]

FINDINGS: VENOUS

Normal compressibility of the common femoral, superficial femoral,
and popliteal veins, as well as the visualized calf veins.
Visualized portions of profunda femoral vein and great saphenous
vein unremarkable. No filling defects to suggest DVT on grayscale or
color Doppler imaging. Doppler waveforms show normal direction of
venous flow, normal respiratory plasticity and response to
augmentation.

Limited views of the contralateral common femoral vein are
unremarkable.

OTHER

None.

Limitations: none
IMPRESSION: No evidence of right lower extremity DVT.

## 2022-03-08 ENCOUNTER — Ambulatory Visit: Payer: Medicaid Other | Admitting: Plastic Surgery

## 2022-03-08 ENCOUNTER — Encounter: Payer: Self-pay | Admitting: Plastic Surgery

## 2022-03-08 ENCOUNTER — Telehealth: Payer: Self-pay | Admitting: Plastic Surgery

## 2022-03-08 VITALS — BP 145/94 | HR 88 | Ht 63.0 in | Wt 233.4 lb

## 2022-03-08 DIAGNOSIS — M546 Pain in thoracic spine: Secondary | ICD-10-CM | POA: Diagnosis not present

## 2022-03-08 DIAGNOSIS — N62 Hypertrophy of breast: Secondary | ICD-10-CM | POA: Diagnosis not present

## 2022-03-08 DIAGNOSIS — F17210 Nicotine dependence, cigarettes, uncomplicated: Secondary | ICD-10-CM | POA: Diagnosis not present

## 2022-03-08 DIAGNOSIS — Z6841 Body Mass Index (BMI) 40.0 and over, adult: Secondary | ICD-10-CM

## 2022-03-08 DIAGNOSIS — M542 Cervicalgia: Secondary | ICD-10-CM

## 2022-03-08 NOTE — Progress Notes (Signed)
Referring Provider Tollie Eth, NP [address] [place],  [place] [address]   CC:  Chief Complaint  Patient presents with   Advice Only      Sherri Jacobson is an 42 y.o. female.  HPI: Sherri Jacobson is a 42 year old female who presents to the office today with complaints of upper back and neck pain which she feels are due to the large size of her breast.  She is interested in a bilateral breast reduction.  She states that she has significant pain in her upper back and neck with neck spasms and indentions in her shoulders from her bra straps.  No Known Allergies  Outpatient Encounter Medications as of 03/08/2022  Medication Sig   EPINEPHrine 0.3 mg/0.3 mL IJ SOAJ injection INJECT 1 PEN IN THE MUSCLE ONE TIME AS DIRECTED   lamoTRIgine (LAMICTAL) 100 MG tablet Take 100 mg by mouth daily.   tiZANidine (ZANAFLEX) 4 MG tablet TAKE 1 TO 2 TABLETS BY MOUTH EVERY NIGHT AT BEDTIME AS NEEDED FOR INSOMNIA   traMADol (ULTRAM) 50 MG tablet Take by mouth every 6 (six) hours as needed.   [DISCONTINUED] fexofenadine (ALLEGRA) 180 MG tablet Take 180 mg by mouth daily.   [DISCONTINUED] ibuprofen (ADVIL) 800 MG tablet Take 1 tablet (800 mg total) by mouth every 6 (six) hours as needed. (Patient not taking: Reported on 03/08/2022)   [DISCONTINUED] oxyCODONE-acetaminophen (PERCOCET) 10-325 MG tablet Take 1 tablet by mouth every 4 (four) hours as needed for pain.   [DISCONTINUED] oxyCODONE-acetaminophen (PERCOCET) 5-325 MG tablet Take 1-2 tablets by mouth every 4 (four) hours as needed for severe pain.   No facility-administered encounter medications on file as of 03/08/2022.     Past Medical History:  Diagnosis Date   Anxiety    Bipolar disease during pregnancy in first trimester ([place]) 03/19/2016   Depression    Encounter for procreative genetic counseling 04/03/2016   Overview:  Genetic counseling visit on 04/10/2016  Aneuploidy screening/ testing:  First trimester screening; results negative   Carrier screening:  Genetic counseling visit pending    Past Surgical History:  Procedure Laterality Date   CESAREAN SECTION  2018   ENDOSCOPIC PLANTAR FASCIOTOMY  2021   GASTROC RECESSION EXTREMITY Left 08/15/2020   Procedure: GASTROC RECESSION EXTREMITY;  Surgeon: Felipa Furnace, DPM;  Location: [facility];  Service: Podiatry;  Laterality: Left;   PLANTAR FASCIA RELEASE Left 08/15/2020   Procedure: ENDOSCOPIC PLANTAR FASCIOTOMY;  Surgeon: Felipa Furnace, DPM;  Location: [facility];  Service: Podiatry;  Laterality: Left;  POP BLOCK   WRIST SURGERY Left 2020   tendon surgery    No family history on file.  Social History   Social History Narrative   Not on file     Review of Systems General: Denies fevers, chills, weight loss CV: Denies chest pain, shortness of breath, palpitations Breast: Large breasts which she feels are contributing to her upper back and neck pain.  She also states that she has low back pain which she feels is also due to the large size of her breast.  Physical Exam    03/08/2022    1:05 PM 08/15/2020    3:35 PM 08/15/2020    2:15 PM  Vitals with BMI  Height 5' 3"$     Weight 233 lbs 6 oz    BMI [id number]    Systolic [id number]  Diastolic 94 85 80  Pulse 88 87 86    General:  No  acute distress,  Alert and oriented, Non-Toxic, Normal speech and affect Breast: The patient has extraordinarily large breasts that are quite pendulous with grade 3 ptosis.  There are no dominant masses and the nipples are normal in appearance without any evidence of nipple discharge.  The sternal notch to nipple distance on the right is 47 cm and 44 cm on the left the fold to nipple distance on the left is 20 cm in the fold to nipple distance on the right is 22 cm Mammogram: Patient is 42 years old and no mammograms are available. Assessment/Plan Symptomatic macromastia: The patient has very large very pendulous and heavy breasts.  There are probably  contributing to her upper back and neck pain.  She does have evidence of grooving in the shoulders from her bra straps.  She is quite overweight with a BMI of 41.3 which likely contributes to some of her upper back and neck pain.  On her initial evaluation I missed that she has an occasional smoker.  I have called her and left a voicemail asking her to return my call at the office so that her surgery can be delayed until she has been smoke-free for at least 8 weeks. Discussed the procedure at length including the location of the incisions and the unpredictable nature of scarring.  We discussed the risks of bleeding, infection, seroma formation.  We discussed the risks of nipple loss due to nipple ischemia.  Because of the length of the patient's breast and the size of the patient and her breast she is at a much higher risk for loss of nipple with a breast reduction.  I have already discussed the possibility of doing a free nipple graft on this patient.  Additionally due to her size she is at a much higher risk for wound complications in the postoperative period.  She understands this.  We discussed the use of drains postoperatively and the postoperative restrictions of no heavy lifting greater than 20 pounds no vigorous activity and no submerging the incisions in water. She had specific questions regarding breast-feeding postoperatively.  We discussed the fact that approximately 9% of women will not be able to successfully breast-feed after a breast reduction.  Additionally she will not be able to breast-feed if she has a free nipple graft.  We also discussed the fact that her breast will increase in size with a subsequent pregnancy and that the breast may regain some or all of their preoperative size.  Again she understands all of this and will let me know if she would like to delay surgery until she is had or attempted to have a second child. Addendum: I spoke with Ms. Lane and explained the risks of smoking  after breast reduction and the much higher risk for her with smoking.  She understands very clearly that I will not proceed with surgery if she has any nicotine in her system.  She will have a urine nicotine test on the day of her preoperative appointment and surgery will be canceled if she is positive. I believe that I can remove between 900 and 1,000 g per breast Camillia Herter 03/08/2022, 2:49 PM

## 2022-03-09 NOTE — Telephone Encounter (Signed)
error 

## 2022-04-02 ENCOUNTER — Telehealth: Payer: Self-pay | Admitting: Plastic Surgery

## 2022-04-02 NOTE — Telephone Encounter (Signed)
Unable to reach pt.  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  Will send my chart message

## 2022-06-01 ENCOUNTER — Other Ambulatory Visit: Payer: Self-pay

## 2022-06-01 ENCOUNTER — Emergency Department: Payer: Medicaid Other

## 2022-06-01 ENCOUNTER — Encounter: Payer: Self-pay | Admitting: Intensive Care

## 2022-06-01 ENCOUNTER — Emergency Department
Admission: EM | Admit: 2022-06-01 | Discharge: 2022-06-01 | Disposition: A | Discharge status: Home / Self Care | Payer: Medicaid Other | Attending: Emergency Medicine | Admitting: Emergency Medicine

## 2022-06-01 DIAGNOSIS — F17200 Nicotine dependence, unspecified, uncomplicated: Secondary | ICD-10-CM | POA: Diagnosis not present

## 2022-06-01 DIAGNOSIS — R0789 Other chest pain: Secondary | ICD-10-CM | POA: Insufficient documentation

## 2022-06-01 DIAGNOSIS — R079 Chest pain, unspecified: Secondary | ICD-10-CM

## 2022-06-01 LAB — CBC
HCT: 45.2 % (ref 36.0–46.0)
Hemoglobin: 14.4 g/dL (ref 12.0–15.0)
MCH: 28.9 pg (ref 26.0–34.0)
MCHC: 31.9 g/dL (ref 30.0–36.0)
MCV: 90.6 fL (ref 80.0–100.0)
Platelets: 280 10*3/uL (ref 150–400)
RBC: 4.99 MIL/uL (ref 3.87–5.11)
RDW: 13.1 % (ref 11.5–15.5)
WBC: 8.7 10*3/uL (ref 4.0–10.5)
nRBC: 0 % (ref 0.0–0.2)

## 2022-06-01 LAB — BASIC METABOLIC PANEL
Anion gap: 10 (ref 5–15)
BUN: 12 mg/dL (ref 6–20)
CO2: 27 mmol/L (ref 22–32)
Calcium: 9.1 mg/dL (ref 8.9–10.3)
Chloride: 103 mmol/L (ref 98–111)
Creatinine, Ser: 0.66 mg/dL (ref 0.44–1.00)
GFR, Estimated: >60 mL/min (ref >60)
Glucose, Bld: 151 mg/dL — ABNORMAL HIGH (ref 70–99)
Potassium: 4 mmol/L (ref 3.5–5.1)
Sodium: 140 mmol/L (ref 135–145)

## 2022-06-01 LAB — POC URINE PREG, ED: Preg Test, Ur: NEGATIVE

## 2022-06-01 LAB — TROPONIN I (HIGH SENSITIVITY)
Troponin I (High Sensitivity): 3 ng/L (ref <18)
Troponin I (High Sensitivity): 2 ng/L (ref <18)

## 2022-06-01 MED ORDER — KETOROLAC TROMETHAMINE 10 MG PO TABS: 10.0000 mg | ORAL_TABLET | Freq: Four times a day (QID) | ORAL | 0 refills | Status: AC | PRN

## 2022-06-01 MED ORDER — IOHEXOL 350 MG/ML SOLN
75.0000 mL | Freq: Once | INTRAVENOUS | Status: AC | PRN
  Administered 2022-06-01: 75 mL via INTRAVENOUS

## 2022-06-01 MED ORDER — KETOROLAC TROMETHAMINE 30 MG/ML IJ SOLN
30.0000 mg | Freq: Once | INTRAMUSCULAR | Status: AC
  Administered 2022-06-01: 30 mg via INTRAVENOUS
  Filled 2022-06-01: qty 1

## 2022-06-01 NOTE — Discharge Instructions (Signed)
Follow-up with your primary care provider if any continued problems or concerns.  A prescription for Toradol was sent to the pharmacy to begin taking every 6 hours with food for inflammation.  This is the same medication that was given to you in your IV in case the pharmacist asked to you had your first dose in the emergency department.  Ice or heat to your chest as needed for discomfort.  Return to the emergency department if any severe worsening of your symptoms or urgent concerns.

## 2022-06-01 NOTE — ED Triage Notes (Signed)
Patient c/o left chest pain with sob. Radiation to left arm and back. Symptoms started yesterday afternoon.  History chronic back pain and prescribed tramadol

## 2022-06-01 NOTE — ED Provider Notes (Signed)
Taking over the care of this patient that was signed out to me by Greig Right, PA-C.  ----------------------------------------- 1:00 PM on 06/01/2022 -----------------------------------------    Patient was made aware that her CT angio is negative for a PE and reassured.  Toradol 30 mg IV was given to her and patient continued to improve.  I explained to her that we are waiting for her second troponin and that her first troponin was 3.  At the time of discharge patient had improved completely and was able to move her shoulder and take deep breaths without any continued chest pain.  I discussed chest wall and muscle skeletal pain.  A prescription for Toradol 10 mg 1 every 6 hours with food was sent to the pharmacy for her to continue taking.  She is to follow-up with her PCP if any continued problems.  Return precautions for returning to the emergency department if any worsening or urgent concerns was discussed.   Tommi Rumps, PA-C 06/01/22 1450    Trinna Post, MD 06/01/22 1910

## 2022-06-01 NOTE — ED Provider Notes (Signed)
[facility] Provider Note    Event Date/Time   First MD Initiated Contact with Patient 06/01/22 1105     (approximate)   History   Chest Pain   HPI  Sherri Jacobson is a 42 y.o. female with history of anxiety, depression, gestational diabetes and smoker presents emergency department with left-sided chest pain.  Patient states symptoms started last night.  Radiates through to her back.  No fever or chills.  No swelling in her legs.  Pain in the chest does radiate to the left arm.  She is not diaphoretic by the pain but it does hurt really bad to take a deep breath.  States she does not feel like she can catch her breath because she cannot take a deep breath.      Physical Exam   Triage Vital Signs: ED Triage Vitals  Enc Vitals Group     BP 06/01/22 1009 (!) 162/99     Pulse Rate 06/01/22 1009 (!) 108     Resp 06/01/22 1009 20     Temp 06/01/22 1009 99.8 F (37.7 C)     Temp Source 06/01/22 1009 Oral     SpO2 06/01/22 1009 99 %     Weight 06/01/22 1010 226 lb (102.5 kg)     Height 06/01/22 1010 5\' 3"  (1.6 m)     Head Circumference --      Peak Flow --      Pain Score 06/01/22 1010 6     Pain Loc --      Pain Edu? --      Excl. in GC? --     Most recent vital signs: Vitals:   06/01/22 1009 06/01/22 1110  BP: (!) 162/99   Pulse: (!) 108 95  Resp: 20 (!) 22  Temp: 99.8 F (37.7 C)   SpO2: 99% 97%     General: Awake, no distress.   CV:  Good peripheral perfusion. regular rate and  rhythm Resp:  Normal effort. Lungs cta Abd:  No distention.   Other:      ED Results / Procedures / Treatments   Labs (all labs ordered are listed, but only abnormal results are displayed) Labs Reviewed  BASIC METABOLIC PANEL - Abnormal; Notable for the following components:      Result Value   Glucose, Bld 151 (*)    All other components within normal limits  CBC  POC URINE PREG, ED  TROPONIN I (HIGH SENSITIVITY)      EKG  EKG   RADIOLOGY Chest x-ray, CTA for PE    PROCEDURES:   .Critical Care E&M  Performed by: Faythe Ghee, PA-C Critical care provider statement:    Critical care time (minutes):  30   Critical care time was exclusive of:  Separately billable procedures and treating other patients   Critical care was necessary to treat or prevent imminent or life-threatening deterioration of the following conditions:  Circulatory failure   Critical care was time spent personally by me on the following activities:  Blood draw for specimens, development of treatment plan with patient or surrogate, examination of patient, interpretation of cardiac output measurements, obtaining history from patient or surrogate, ordering and performing treatments and interventions, ordering and review of laboratory studies, ordering and review of radiographic studies, pulse oximetry and review of old charts After initial E/M assessment, critical care services were subsequently performed that were exclusive of separately billable procedures or treatment.      MEDICATIONS ORDERED  IN ED: Medications - No data to display   IMPRESSION / MDM / ASSESSMENT AND PLAN / ED COURSE  I reviewed the triage vital signs and the nursing notes.                              Differential diagnosis includes, but is not limited to, PE, MI, CAP, chest wall pain, esophagitis  Patient's presentation is most consistent with acute presentation with potential threat to life or bodily function.   Labs are reassuring, CBC metabolic panel and troponin appear to be normal  Chest x-ray independently reviewed and interpreted by me as being negative for acute abnormality  EKG showed normal sinus rhythm, see physician right  To the patient's symptoms and pain along with a deep breath and unable to catch her breath with elevated respirations will do CTA for PE   Care being transferred to Bridget Hartshorn, PA-C  FINAL CLINICAL  IMPRESSION(S) / ED DIAGNOSES   Final diagnoses:  Acute chest pain     Rx / DC Orders   ED Discharge Orders     None        Note:  This document was prepared using Dragon voice recognition software and may include unintentional dictation errors.    Faythe Ghee, PA-C 06/01/22 1141    Trinna Post, MD 06/01/22 1910
